# Patient Record
Sex: Female | Born: 1980
Health system: Southern US, Community
[De-identification: ages and names within clinical notes are randomized; demographics above are authoritative.]

## PROBLEM LIST (undated history)

## (undated) DIAGNOSIS — R569 Unspecified convulsions: Secondary | ICD-10-CM

## (undated) DIAGNOSIS — R519 Headache, unspecified: Secondary | ICD-10-CM

## (undated) DIAGNOSIS — K219 Gastro-esophageal reflux disease without esophagitis: Secondary | ICD-10-CM

## (undated) DIAGNOSIS — K7689 Other specified diseases of liver: Secondary | ICD-10-CM

## (undated) DIAGNOSIS — G709 Myoneural disorder, unspecified: Secondary | ICD-10-CM

## (undated) DIAGNOSIS — R7989 Other specified abnormal findings of blood chemistry: Secondary | ICD-10-CM

## (undated) DIAGNOSIS — Z87898 Personal history of other specified conditions: Secondary | ICD-10-CM

## (undated) HISTORY — PX: NO PAST SURGERIES: SHX2092

---
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
## 2001-10-03 ENCOUNTER — Other Ambulatory Visit: Admission: RE | Admit: 2001-10-03 | Discharge: 2001-10-03 | Payer: Self-pay | Admitting: Gynecology

## 2003-06-22 ENCOUNTER — Other Ambulatory Visit: Admission: RE | Admit: 2003-06-22 | Discharge: 2003-06-22 | Payer: Self-pay | Admitting: Obstetrics and Gynecology

## 2003-12-27 ENCOUNTER — Inpatient Hospital Stay (HOSPITAL_COMMUNITY): Admission: AD | Admit: 2003-12-27 | Discharge: 2003-12-30 | Payer: Self-pay | Admitting: Obstetrics and Gynecology

## 2003-12-31 ENCOUNTER — Encounter: Admission: RE | Admit: 2003-12-31 | Discharge: 2004-01-30 | Payer: Self-pay | Admitting: Obstetrics and Gynecology

## 2004-07-04 ENCOUNTER — Other Ambulatory Visit: Admission: RE | Admit: 2004-07-04 | Discharge: 2004-07-04 | Payer: Self-pay | Admitting: Obstetrics and Gynecology

## 2004-10-31 ENCOUNTER — Emergency Department (HOSPITAL_COMMUNITY): Admission: EM | Admit: 2004-10-31 | Discharge: 2004-10-31 | Payer: Self-pay | Admitting: Emergency Medicine

## 2005-07-30 ENCOUNTER — Other Ambulatory Visit: Admission: RE | Admit: 2005-07-30 | Discharge: 2005-07-30 | Payer: Self-pay | Admitting: Obstetrics and Gynecology

## 2007-07-05 ENCOUNTER — Emergency Department (HOSPITAL_COMMUNITY): Admission: EM | Admit: 2007-07-05 | Discharge: 2007-07-06 | Payer: Self-pay | Admitting: Emergency Medicine

## 2008-01-06 ENCOUNTER — Encounter (INDEPENDENT_AMBULATORY_CARE_PROVIDER_SITE_OTHER): Payer: Self-pay | Admitting: *Deleted

## 2008-07-09 ENCOUNTER — Ambulatory Visit: Payer: Self-pay | Admitting: Diagnostic Radiology

## 2008-07-09 ENCOUNTER — Emergency Department (HOSPITAL_BASED_OUTPATIENT_CLINIC_OR_DEPARTMENT_OTHER): Admission: EM | Admit: 2008-07-09 | Discharge: 2008-07-09 | Payer: Self-pay | Admitting: Emergency Medicine

## 2010-08-09 ENCOUNTER — Emergency Department (INDEPENDENT_AMBULATORY_CARE_PROVIDER_SITE_OTHER): Payer: 59

## 2010-08-09 ENCOUNTER — Emergency Department (HOSPITAL_BASED_OUTPATIENT_CLINIC_OR_DEPARTMENT_OTHER)
Admission: EM | Admit: 2010-08-09 | Discharge: 2010-08-09 | Disposition: A | Discharge status: Home / Self Care | Payer: 59 | Attending: Emergency Medicine | Admitting: Emergency Medicine

## 2010-08-09 DIAGNOSIS — G5690 Unspecified mononeuropathy of unspecified upper limb: Secondary | ICD-10-CM | POA: Insufficient documentation

## 2010-08-09 DIAGNOSIS — R209 Unspecified disturbances of skin sensation: Secondary | ICD-10-CM | POA: Insufficient documentation

## 2010-08-09 DIAGNOSIS — R279 Unspecified lack of coordination: Secondary | ICD-10-CM

## 2010-08-09 DIAGNOSIS — F17200 Nicotine dependence, unspecified, uncomplicated: Secondary | ICD-10-CM | POA: Insufficient documentation

## 2010-08-09 DIAGNOSIS — R29898 Other symptoms and signs involving the musculoskeletal system: Secondary | ICD-10-CM

## 2010-09-02 LAB — BASIC METABOLIC PANEL
GFR calc non Af Amer: >60 mL/min (ref >60)
Potassium: 3.4 mEq/L — ABNORMAL LOW (ref 3.5–5.1)
Sodium: 137 mEq/L (ref 135–145)

## 2010-09-02 LAB — DIFFERENTIAL
Eosinophils Relative: 2 % (ref 0–5)
Lymphocytes Relative: 28 % (ref 12–46)
Lymphs Abs: 2.5 10*3/uL (ref 0.7–4.0)
Monocytes Absolute: 0.7 10*3/uL (ref 0.1–1.0)
Monocytes Relative: 8 % (ref 3–12)

## 2010-09-02 LAB — D-DIMER, QUANTITATIVE: D-Dimer, Quant: <0.22 ug/mL-FEU (ref 0.00–0.48)

## 2010-09-02 LAB — CBC
HCT: 38.1 % (ref 36.0–46.0)
Hemoglobin: 13 g/dL (ref 12.0–15.0)
RBC: 4.15 MIL/uL (ref 3.87–5.11)
WBC: 8.9 10*3/uL (ref 4.0–10.5)

## 2010-09-02 LAB — POCT CARDIAC MARKERS
CKMB, poc: <1 ng/mL — ABNORMAL LOW (ref 1.0–8.0)
Myoglobin, poc: 21.6 ng/mL (ref 12–200)

## 2010-09-16 ENCOUNTER — Ambulatory Visit: Payer: 59 | Admitting: Occupational Therapy

## 2010-10-03 NOTE — Discharge Summary (Signed)
NAME:  Stacey Pena, Stacey Pena                          ACCOUNT NO.:  [id number]   MEDICAL RECORD NO.:  [id number]                   PATIENT TYPE:  INP   LOCATION:  9114                                 FACILITY:  [facility]   PHYSICIAN:  Malachi Pro. Ambrose Mantle, M.D.              DATE OF BIRTH:  [birth year]   DATE OF ADMISSION:  12/27/2003  DATE OF DISCHARGE:  12/30/2003                                 DISCHARGE SUMMARY   A 30 year old white female, para 0, gravida 1, at 40-4/[redacted] weeks gestation  with an [facility] of December 24, 2003, by a 7 week ultrasound, presented to  maternity admission unit approximately 2200 hours on December 27, 2003, with  complaint of contractions every 3-5 minutes which were very painful.  Cervix  had minimal change from her office exam the day prior to admission.  Cervix  2-3 cm, 70%, vertex at a -1, but given the patient's degree of discomfort,  she was admitted for pain relief and Pitocin augmentation.  The patient  continued to progress with minimal Pitocin, approximately 6 milliunits, and  received an epidural at 5 cm.  Prenatal care was uncomplicated.  Blood group  and type O positive with a negative antibody, RPR nonreactive, rubella  equivocal, hepatitis B surface antigen negative, HIV normal, GC and  Chlamydia negative, triple screen normal, group B strep negative, 1 hour  Glucola 87.   PAST OBSTETRIC HISTORY:  Negative.   GYNECOLOGIC HISTORY:  Negative.   ILLNESSES:  Asthma and depression.   SURGICAL HISTORY:  Negative.   ALLERGIES:  None.   MEDICATIONS:  None.   PHYSICAL EXAMINATION:  VITAL SIGNS:  On admission, the patient was afebrile  with normal vital signs.  Fetal heart tones were overall reassuring.  HEART:  Normal size and sounds.  No murmurs.  LUNGS:  Clear to P&A.  ABDOMEN:  Soft,  estimated fetal weight 7.5-8 pounds.  PELVIC:  At 6:30 a.m. on August 12, the cervix was 8 cm.  Artificial rupture  of the membranes produced clear fluid.   The patient reached full  dilatation and pushed well.  She delivered  spontaneously OA over a partial third-degree extension of a midline  laceration by Dr. Ambrose Mantle, a living female infant, 7 pounds 11 ounces, Apgars  of 9 at one and 9 at five minutes.  The placenta was intact.  Uterus normal.  Rectal negative.  Sphincter reinforced with 2-0 Vicryl.  Midline laceration  repaired with 2-0 Vicryl.  Blood loss about 400 mL.  Postpartum, the patient  did well and was discharged on the second postpartum day.  Initial  hemoglobin 13.6, hematocrit 40.1, white count 21,200, platelet count 214,00.  Follow up hemoglobin 12.5, RPR nonreactive.   FINAL DIAGNOSES:  1. Intrauterine pregnancy at 40+ weeks.  2. Delivered OA.   OPERATION:  Spontaneous delivery OA.  Midline laceration with partial third-  degree extension.   FINAL CONDITION:  Improved.   Instructions include our regular discharge instruction booklet.  The patient  is advised to return to the office in 6 weeks for follow up examination.  Percocet 5/325, #24 tabs 1-2 q.4-6h. p.r.n. pain is given at discharge.                                               Malachi Pro. Ambrose Mantle, M.D.    TFH/MEDQ  D:  12/30/2003  T:  12/30/2003  Job:  [id number]

## 2016-09-04 ENCOUNTER — Other Ambulatory Visit: Payer: Self-pay | Admitting: Family Medicine

## 2016-09-04 DIAGNOSIS — R1084 Generalized abdominal pain: Secondary | ICD-10-CM

## 2016-09-04 DIAGNOSIS — R197 Diarrhea, unspecified: Secondary | ICD-10-CM

## 2016-09-04 DIAGNOSIS — R110 Nausea: Secondary | ICD-10-CM

## 2016-09-07 ENCOUNTER — Other Ambulatory Visit: Payer: Self-pay

## 2016-09-07 ENCOUNTER — Ambulatory Visit
Admission: RE | Admit: 2016-09-07 | Discharge: 2016-09-07 | Disposition: A | Discharge status: Home / Self Care | Source: Ambulatory Visit | Attending: Family Medicine | Admitting: Family Medicine

## 2016-09-07 DIAGNOSIS — R110 Nausea: Secondary | ICD-10-CM

## 2016-09-07 DIAGNOSIS — R1084 Generalized abdominal pain: Secondary | ICD-10-CM

## 2016-09-07 DIAGNOSIS — R197 Diarrhea, unspecified: Secondary | ICD-10-CM

## 2016-09-07 MED ORDER — IOPAMIDOL (ISOVUE-300) INJECTION 61%
100.0000 mL | Freq: Once | INTRAVENOUS | Status: AC | PRN
  Administered 2016-09-07: 100 mL via INTRAVENOUS

## 2016-09-11 ENCOUNTER — Other Ambulatory Visit: Payer: Self-pay | Admitting: Family Medicine

## 2016-09-11 DIAGNOSIS — R160 Hepatomegaly, not elsewhere classified: Secondary | ICD-10-CM

## 2016-09-26 ENCOUNTER — Ambulatory Visit
Admission: RE | Admit: 2016-09-26 | Discharge: 2016-09-26 | Disposition: A | Discharge status: Home / Self Care | Source: Ambulatory Visit | Attending: Family Medicine | Admitting: Family Medicine

## 2016-09-26 DIAGNOSIS — R160 Hepatomegaly, not elsewhere classified: Secondary | ICD-10-CM

## 2016-09-26 MED ORDER — GADOXETATE DISODIUM 0.25 MMOL/ML IV SOLN
8.0000 mL | Freq: Once | INTRAVENOUS | Status: AC | PRN
  Administered 2016-09-26: 8 mL via INTRAVENOUS

## 2017-05-31 ENCOUNTER — Encounter: Payer: Self-pay | Admitting: Neurology

## 2017-05-31 ENCOUNTER — Ambulatory Visit (INDEPENDENT_AMBULATORY_CARE_PROVIDER_SITE_OTHER): Admitting: Neurology

## 2017-05-31 VITALS — BP 119/79 | HR 91 | Ht 65.0 in | Wt 176.0 lb

## 2017-05-31 DIAGNOSIS — R569 Unspecified convulsions: Secondary | ICD-10-CM | POA: Insufficient documentation

## 2017-05-31 NOTE — Progress Notes (Signed)
PATIENT: Stacey Pena DOB: 01-01-1981  Chief Complaint  Patient presents with   New Patient (Initial Visit)    Patient had her first seizure last weekend.     HISTORICAL  Stacey Pena is a 37 year old female, accompanied by her husband, seen in refer by primary care doctor Conley RollsLe, Thao P, for evaluation of seizure,Initial evaluation was on May 31, 2017.  She was previously healthy, deny a personal family history of seizure  On May 22, 2017, while shopping with her daughter in the mall, without warning signs, she suddenly her right arm, sliding to the floor, then had a witnessed generalized tonic-clonic seizure, no incontinence, with right lateral tongue biting.  Seizure last about 2 minutes, [facility],  laboratory evaluations normal CBC, hemoglobin 12.9, CMP, creatinine 0.6, CT head without contrast showed no acute abnormality  She recently started a new job  She had frequent abdominal pain, GI symptoms since summer of 2018, MRI of abdomen in May 2018 showed multiple enhancing hepatic lesions in both lobes, measuring up to 2.8 cm, favoring the diagnosis of FNH,  REVIEW OF SYSTEMS: Full 14 system review of systems performed and notable only for seizure, feeling hot, cold  ALLERGIES: Not on File  HOME MEDICATIONS: Current Outpatient Medications  Medication Sig Dispense Refill   ondansetron (ZOFRAN-ODT) 4 MG disintegrating tablet Take 1 tablet by mouth as needed.     No current facility-administered medications for this visit.     PAST MEDICAL HISTORY: History reviewed. No pertinent past medical history.  PAST SURGICAL HISTORY: History reviewed. No pertinent surgical history.  FAMILY HISTORY: History reviewed. No pertinent family history.  SOCIAL HISTORY:  Social History   Socioeconomic History   Marital status: Married    Spouse name: Not on file   Number of children: Not on file   Years of education: Not on file    Highest education level: Not on file  Social Needs   Financial resource strain: Not on file   Food insecurity - worry: Not on file   Food insecurity - inability: Not on file   Transportation needs - medical: Not on file   Transportation needs - non-medical: Not on file  Occupational History   Not on file  Tobacco Use   Smoking status: Former Smoker   Smokeless tobacco: Never Used  Substance and Sexual Activity   Alcohol use: Yes    Frequency: Never    Comment: 2-3 nights/wk   Drug use: No   Sexual activity: Not on file  Other Topics Concern   Not on file  Social History Narrative   Not on file     PHYSICAL EXAM   Vitals:   05/31/17 1518  BP: 119/79  Pulse: 91  Weight: 176 lb (79.8 kg)  Height: 5\' 5"  (1.651 m)    Not recorded      Body mass index is 29.29 kg/m.  PHYSICAL EXAMNIATION:  Gen: NAD, conversant, well nourised, obese, well groomed                     Cardiovascular: Regular rate rhythm, no peripheral edema, warm, nontender. Eyes: Conjunctivae clear without exudates or hemorrhage Neck: Supple, no carotid bruits. Pulmonary: Clear to auscultation bilaterally   NEUROLOGICAL EXAM:  MENTAL STATUS: Speech:    Speech is normal; fluent and spontaneous with normal comprehension.  Cognition:     Orientation to time, place and person     Normal recent and remote memory  Normal Attention span and concentration     Normal Language, naming, repeating,spontaneous speech     Fund of knowledge   CRANIAL NERVES: CN II: Visual fields are full to confrontation. Fundoscopic exam is normal with sharp discs and no vascular changes. Pupils are round equal and briskly reactive to light. CN III, IV, VI: extraocular movement are normal. No ptosis. CN V: Facial sensation is intact to pinprick in all 3 divisions bilaterally. Corneal responses are intact.  CN VII: Face is symmetric with normal eye closure and smile. CN VIII: Hearing is normal to rubbing  fingers CN IX, X: Palate elevates symmetrically. Phonation is normal. CN XI: Head turning and shoulder shrug are intact CN XII: Tongue is midline with normal movements and no atrophy.  MOTOR: There is no pronator drift of out-stretched arms. Muscle bulk and tone are normal. Muscle strength is normal.  REFLEXES: Reflexes are 2+ and symmetric at the biceps, triceps, knees, and ankles. Plantar responses are flexor.  SENSORY: Intact to light touch, pinprick, positional sensation and vibratory sensation are intact in fingers and toes.  COORDINATION: Rapid alternating movements and fine finger movements are intact. There is no dysmetria on finger-to-nose and heel-knee-shin.    GAIT/STANCE: Posture is normal. Gait is steady with normal steps, base, arm swing, and turning. Heel and toe walking are normal. Tandem gait is normal.  Romberg is absent.   DIAGNOSTIC DATA (LABS, IMAGING, TESTING) - I reviewed patient records, labs, notes, testing and imaging myself where available.   ASSESSMENT AND PLAN  Stacey Pena is a 37 y.o. female   New onset seizure on May 22, 2017  MRI brain w/wo  EEG  No driving until seizure free for 6 months   Levert Feinstein, M.D. Ph.D.  [facility] [address], [address] [place], [place] [address] Ph: [phone] Fax: [phone]  CC:Le, Thao P, DO

## 2017-06-07 ENCOUNTER — Ambulatory Visit (INDEPENDENT_AMBULATORY_CARE_PROVIDER_SITE_OTHER): Admitting: Neurology

## 2017-06-07 DIAGNOSIS — R569 Unspecified convulsions: Secondary | ICD-10-CM

## 2017-06-11 NOTE — Procedures (Signed)
° °  HISTORY: 37 year old female, with history of seizure  TECHNIQUE:  16 channel EEG was performed based on standard 10-16 international system. One channel was dedicated to EKG, which has demonstrates normal sinus rhythm of 78 beats per minutes.  Upon awakening, the posterior background activity was dysarrhythmic, 6-7 Hz, theta range, reactive to eye opening and closure.  There was no evidence of epileptiform discharge.  Photic stimulation was performed, which induced a symmetric photic driving.  Hyperventilation was performed, there was no abnormality elicit.  Stage II sleep was achieved, as evident by K complexes, sleep spindles,  CONCLUSION: This is a an abnormal awake and asleep EEG.  There is electrodiagnostic evidence of generalized background slowing, indicating bi- hemisphere malfunction.  Levert FeinsteinYijun Alegandra Sommers, M.D. Ph.D.  [facility] [address] [place], [place] [address] Phone: [phone] Fax:      [phone]

## 2017-06-13 ENCOUNTER — Ambulatory Visit
Admission: RE | Admit: 2017-06-13 | Discharge: 2017-06-13 | Disposition: A | Discharge status: Home / Self Care | Source: Ambulatory Visit | Attending: Neurology | Admitting: Neurology

## 2017-06-13 DIAGNOSIS — R569 Unspecified convulsions: Secondary | ICD-10-CM | POA: Diagnosis not present

## 2017-06-13 MED ORDER — GADOBENATE DIMEGLUMINE 529 MG/ML IV SOLN
14.0000 mL | Freq: Once | INTRAVENOUS | Status: AC | PRN
  Administered 2017-06-13: 14 mL via INTRAVENOUS

## 2017-06-14 ENCOUNTER — Telehealth: Payer: Self-pay | Admitting: Neurology

## 2017-06-14 MED ORDER — LAMOTRIGINE 25 MG PO TABS: ORAL_TABLET | ORAL | 0 refills | Status: DC

## 2017-06-14 MED ORDER — LAMOTRIGINE 100 MG PO TABS: 100.0000 mg | ORAL_TABLET | Freq: Two times a day (BID) | ORAL | 11 refills | Status: DC

## 2017-06-14 NOTE — Telephone Encounter (Signed)
I have called patient, MRI of the brain is normal, EEG showed generalized slowing, dysarrythmic activity,  With the description of seizure activity starting with right arm, generalized tonic-clonic seizure,  Above findings could indicate partial complex with secondary generalization  Proceed with lamotrigine, 25 titrating to 100 mg twice a day,  No driving until seizure-free for 6 months  Keep follow-up appointment on July 12, 2017.

## 2017-06-28 ENCOUNTER — Other Ambulatory Visit: Payer: Self-pay | Admitting: *Deleted

## 2017-06-28 ENCOUNTER — Encounter: Payer: Self-pay | Admitting: *Deleted

## 2017-06-28 MED ORDER — LEVETIRACETAM 500 MG PO TABS: 500.0000 mg | ORAL_TABLET | Freq: Two times a day (BID) | ORAL | 11 refills | Status: DC

## 2017-06-28 NOTE — Telephone Encounter (Signed)
Patient upped dosage of lamoTRIgine (LAMICTAL) 25 MG tablet to 75mg  2 times a day yesterday. Today she has a rash all over her body and worse on her neck. Please call and advise.

## 2017-06-28 NOTE — Addendum Note (Signed)
Addended by: Lindell SparKIRKMAN, Babygirl Trager C on: 06/28/2017 12:08 PM   Modules accepted: Orders

## 2017-06-28 NOTE — Telephone Encounter (Signed)
Dr. Terrace ArabiaYan reviewed chart.  Per vo by Dr. Terrace ArabiaYan, discontinue lamotrigine and start levetiracetam 500mg , one tablet BID.  She does not currently have mood disorder.  Reviewed potential side effects of levetiracetam with patient.  She was also instructed to drink lots of water to flush system.  Lamotrigine added to her allergy list.  She is agreeable to this plan.  [place] and the new prescription was sent to the [facility] close to her office.

## 2017-07-12 ENCOUNTER — Telehealth: Payer: Self-pay | Admitting: Neurology

## 2017-07-12 ENCOUNTER — Ambulatory Visit (INDEPENDENT_AMBULATORY_CARE_PROVIDER_SITE_OTHER): Admitting: Neurology

## 2017-07-12 ENCOUNTER — Encounter: Payer: Self-pay | Admitting: Neurology

## 2017-07-12 VITALS — BP 141/95 | HR 91 | Ht 65.0 in | Wt 172.0 lb

## 2017-07-12 DIAGNOSIS — R569 Unspecified convulsions: Secondary | ICD-10-CM

## 2017-07-12 MED ORDER — LEVETIRACETAM 500 MG PO TABS: 500.0000 mg | ORAL_TABLET | Freq: Two times a day (BID) | ORAL | 4 refills | Status: DC

## 2017-07-12 NOTE — Telephone Encounter (Signed)
Called patient to schedule sleep deprived EEG. Patient states she would

## 2017-07-12 NOTE — Progress Notes (Signed)
PATIENT: Stacey Pena DOB: 02/27/1981  Chief Complaint  Patient presents with   Seizures    She is here with her husband, Vincenza Hews. She would like to review her EEG and MRI.  She had rash with Lamictal.  She is doing well on Keppra 500mg , one tablet BID.  No further seizures reported.     HISTORICAL  Stacey Pena is a 37 year old female, accompanied by her husband, seen in refer by primary care doctor Conley Rolls, Thao P, for evaluation of seizure,Initial evaluation was on May 31, 2017.  She was previously healthy, deny a personal family history of seizure  On May 22, 2017, while shopping with her daughter in the mall, without warning signs, she suddenly her right arm, sliding to the floor, then had a witnessed generalized tonic-clonic seizure, no incontinence, with right lateral tongue biting.  Seizure last about 2 minutes, [facility],  laboratory evaluations normal CBC, hemoglobin 12.9, CMP, creatinine 0.6, CT head without contrast showed no acute abnormality  She recently started a new job  She had frequent abdominal pain, GI symptoms since summer of 2018, MRI of abdomen in May 2018 showed multiple enhancing hepatic lesions in both lobes, measuring up to 2.8 cm, favoring the diagnosis of FNH,  Update July 12, 2017:  MRI of the brain with and without contrast was normal on June 13, 2017.  EEG in January 2019 showed evidence of mild background slowing,  She has tried lamotrigine, cause rash, she is now taking Keppra 500 mg twice a day, tolerating it was normal well.   REVIEW OF SYSTEMS: Full 14 system review of systems performed and notable only for seizure, feeling hot, cold  ALLERGIES: Allergies  Allergen Reactions   Gadolinium Derivatives Rash    Pt had a rash across her chest following her injection of Multihance.  Rash assessed by Dr. Tyron Russell, pt given 50mg  grams taken immediately and released with instructions to call if anything new  arose.     Lamictal [Lamotrigine] Rash    HOME MEDICATIONS: Current Outpatient Medications  Medication Sig Dispense Refill   ciprofloxacin (CIPRO) 500 MG tablet Take 500 mg by mouth 2 (two) times daily.     levETIRAcetam (KEPPRA) 500 MG tablet Take 1 tablet (500 mg total) by mouth 2 (two) times daily. 60 tablet 11   ondansetron (ZOFRAN-ODT) 4 MG disintegrating tablet Take 1 tablet by mouth as needed.     No current facility-administered medications for this visit.     PAST MEDICAL HISTORY: History reviewed. No pertinent past medical history.  PAST SURGICAL HISTORY: History reviewed. No pertinent surgical history.  FAMILY HISTORY: History reviewed. No pertinent family history.  SOCIAL HISTORY:  Social History   Socioeconomic History   Marital status: Married    Spouse name: Not on file   Number of children: Not on file   Years of education: Not on file   Highest education level: Not on file  Social Needs   Financial resource strain: Not on file   Food insecurity - worry: Not on file   Food insecurity - inability: Not on file   Transportation needs - medical: Not on file   Transportation needs - non-medical: Not on file  Occupational History   Not on file  Tobacco Use   Smoking status: Former Smoker   Smokeless tobacco: Never Used  Substance and Sexual Activity   Alcohol use: Yes    Frequency: Never    Comment: 2-3 nights/wk  Drug use: No   Sexual activity: Not on file  Other Topics Concern   Not on file  Social History Narrative   Not on file     PHYSICAL EXAM   Vitals:   07/12/17 1025  BP: (!) 141/95  Pulse: 91  Weight: 172 lb (78 kg)  Height: 5\' 5"  (1.651 m)    Not recorded      Body mass index is 28.62 kg/m.  PHYSICAL EXAMNIATION:  Gen: NAD, conversant, well nourised, obese, well groomed                     Cardiovascular: Regular rate rhythm, no peripheral edema, warm, nontender. Eyes: Conjunctivae clear without  exudates or hemorrhage Neck: Supple, no carotid bruits. Pulmonary: Clear to auscultation bilaterally   NEUROLOGICAL EXAM:  MENTAL STATUS: Speech:    Speech is normal; fluent and spontaneous with normal comprehension.  Cognition:     Orientation to time, place and person     Normal recent and remote memory     Normal Attention span and concentration     Normal Language, naming, repeating,spontaneous speech     Fund of knowledge   CRANIAL NERVES: CN II: Visual fields are full to confrontation. Fundoscopic exam is normal with sharp discs and no vascular changes. Pupils are round equal and briskly reactive to light. CN III, IV, VI: extraocular movement are normal. No ptosis. CN V: Facial sensation is intact to pinprick in all 3 divisions bilaterally. Corneal responses are intact.  CN VII: Face is symmetric with normal eye closure and smile. CN VIII: Hearing is normal to rubbing fingers CN IX, X: Palate elevates symmetrically. Phonation is normal. CN XI: Head turning and shoulder shrug are intact CN XII: Tongue is midline with normal movements and no atrophy.  MOTOR: There is no pronator drift of out-stretched arms. Muscle bulk and tone are normal. Muscle strength is normal.  REFLEXES: Reflexes are 2+ and symmetric at the biceps, triceps, knees, and ankles. Plantar responses are flexor.  SENSORY: Intact to light touch, pinprick, positional sensation and vibratory sensation are intact in fingers and toes.  COORDINATION: Rapid alternating movements and fine finger movements are intact. There is no dysmetria on finger-to-nose and heel-knee-shin.    GAIT/STANCE: Posture is normal. Gait is steady with normal steps, base, arm swing, and turning. Heel and toe walking are normal. Tandem gait is normal.  Romberg is absent.   DIAGNOSTIC DATA (LABS, IMAGING, TESTING) - I reviewed patient records, labs, notes, testing and imaging myself where available.   ASSESSMENT AND PLAN  [name]  Blouch is a 37 y.o. female   New onset seizure on May 22, 2017  MRI brain w/wo was normal  EEG showed mild background slowing.  Keep keppra 500mg  bid  Repeat sleep deprived EEG.  No driving until seizure free for 6 months     Levert FeinsteinYijun Felder Lebeda, M.D. Ph.D.  [facility] [address], [address] [place], [place] [address] Ph: [phone] Fax: [phone]  CC:Le, Thao P, DO

## 2017-07-13 ENCOUNTER — Telehealth: Payer: Self-pay | Admitting: Neurology

## 2017-07-13 NOTE — Telephone Encounter (Signed)
Please disregard previous message.

## 2017-07-13 NOTE — Telephone Encounter (Signed)
Called patient to schedule sleep deprived EEG that is closer to one year follow-up. Left patient a voicemail to call us back to schedule.

## 2017-07-13 NOTE — Telephone Encounter (Signed)
Called patient to schedule her sleep deprived EEG. Patient states she would like an EEG near her one year follow-up.

## 2017-12-27 ENCOUNTER — Telehealth: Payer: Self-pay | Admitting: Neurology

## 2017-12-27 NOTE — Telephone Encounter (Signed)
Spoke to patient - she is concerned about frequent headaches over the last three weeks.  She estimates having them 3-4 times per week.  Ibuprofen is mildly helpful.  She tends to get more relief by lying in a dark room with a cool compress on her head.  She has also noticed her right eyelid drooping intermittently.  This symptom has also been present for three weeks and can occur with or without a headache.  She has been on Keppra 500mg  twice daily since 06/28/17.  She will come in for an appt this week.  I have asked her to take a picture of her eye when it is actively drooping so that Dr. Terrace ArabiaYan can review at her appt.

## 2017-12-27 NOTE — Telephone Encounter (Signed)
Pt is getting HA's on right side of the head and noticed the right eye lid droops for the past 3 weeks intermittently. She is concerned levETIRAcetam (KEPPRA) 500 MG tablet could be causing this. Please call to advise

## 2017-12-30 ENCOUNTER — Encounter: Payer: Self-pay | Admitting: Neurology

## 2017-12-30 ENCOUNTER — Ambulatory Visit: Payer: BLUE CROSS/BLUE SHIELD | Admitting: Neurology

## 2017-12-30 VITALS — BP 132/88 | HR 89 | Ht 65.0 in | Wt 168.8 lb

## 2017-12-30 DIAGNOSIS — G43709 Chronic migraine without aura, not intractable, without status migrainosus: Secondary | ICD-10-CM | POA: Diagnosis not present

## 2017-12-30 DIAGNOSIS — R569 Unspecified convulsions: Secondary | ICD-10-CM | POA: Diagnosis not present

## 2017-12-30 DIAGNOSIS — IMO0002 Reserved for concepts with insufficient information to code with codable children: Secondary | ICD-10-CM

## 2017-12-30 MED ORDER — SUMATRIPTAN SUCCINATE 50 MG PO TABS: 50.0000 mg | ORAL_TABLET | ORAL | 6 refills | Status: DC | PRN

## 2017-12-30 NOTE — Progress Notes (Signed)
PATIENT: Stacey Pena Anacker DOB: [birth year]  Chief Complaint  Patient presents with   Headache    She is concerned about frequent headaches over the last three weeks.  She estimates having them 3-4 times per week.  Ibuprofen is mildly helpful.  She tends to get more relief by lying in a dark room with a cool compress on her head.  She has also noticed her right eyelid drooping intermittently (she has pictures).  This symptom has also been present for three weeks and can occur with or without a headache.   Seizures    She is taking Keppra 500mg  BID, as prescribed.  No seizure activity reported.     HISTORICAL  Stacey Pena Kranz is a 37 year old female, accompanied by her husband, seen in refer by primary care doctor Conley RollsLe, Thao P, for evaluation of seizure,Initial evaluation was on May 31, 2017.  She was previously healthy, deny a personal family history of seizure  On May 22, 2017, while shopping with her daughter in the mall, without warning signs, she suddenly her right arm, sliding to the floor, then had a witnessed generalized tonic-clonic seizure, no incontinence, with right lateral tongue biting.  Seizure last about 2 minutes, [facility],  laboratory evaluations normal CBC, hemoglobin 12.9, CMP, creatinine 0.6, CT head without contrast showed no acute abnormality  She recently started a new job  She had frequent abdominal pain, GI symptoms since summer of 2018, MRI of abdomen in May 2018 showed multiple enhancing hepatic lesions in both lobes, measuring up to 2.8 cm, favoring the diagnosis of FNH,  Update July 12, 2017:  MRI of the brain with and without contrast was normal on June 13, 2017.  EEG in January 2019 showed evidence of mild background slowing,  She has tried lamotrigine, cause rash, she is now taking Keppra 500 mg twice a day, tolerating it was normal well.  UPDATE December 30 2017: She complains of frequent right-sided headaches  since July 2019, about 3-4 times each week, right retro-orbital, moderate pain, she tends to close her eyes which helps her headaches sound, oftentimes is getting worse as day goes on,  She has no recurrent seizure REVIEW OF SYSTEMS: Full 14 system review of systems performed and notable only for memory loss, droopy eyelid, agitation, abdominal pain, nausea vomiting blurry vision, chills, excessive sweating  ALLERGIES: Allergies  Allergen Reactions   Lamictal [Lamotrigine] Rash   Gadolinium Derivatives Rash    Pt had a rash across her chest following her injection of Multihance.  Rash assessed by Dr. Tyron RussellBoles, pt given 50mg  grams taken immediately and released with instructions to call if anything new arose.      HOME MEDICATIONS: Current Outpatient Medications  Medication Sig Dispense Refill   ibuprofen (ADVIL,MOTRIN) 200 MG tablet Take 200 mg by mouth daily as needed.     levETIRAcetam (KEPPRA) 500 MG tablet Take 1 tablet (500 mg total) by mouth 2 (two) times daily. 180 tablet 4   ondansetron (ZOFRAN-ODT) 4 MG disintegrating tablet Take 1 tablet by mouth as needed.     No current facility-administered medications for this visit.     PAST MEDICAL HISTORY: History reviewed. No pertinent past medical history.  PAST SURGICAL HISTORY: History reviewed. No pertinent surgical history.  FAMILY HISTORY: History reviewed. No pertinent family history.  SOCIAL HISTORY:  Social History   Socioeconomic History   Marital status: Married    Spouse name: Not on file   Number of children:  Not on file   Years of education: Not on file   Highest education level: Not on file  Occupational History   Not on file  Social Needs   Financial resource strain: Not on file   Food insecurity:    Worry: Not on file    Inability: Not on file   Transportation needs:    Medical: Not on file    Non-medical: Not on file  Tobacco Use   Smoking status: Former Smoker   Smokeless tobacco:  Never Used  Substance and Sexual Activity   Alcohol use: Yes    Frequency: Never    Comment: 2-3 nights/wk   Drug use: No   Sexual activity: Not on file  Lifestyle   Physical activity:    Days per week: Not on file    Minutes per session: Not on file   Stress: Not on file  Relationships   Social connections:    Talks on phone: Not on file    Gets together: Not on file    Attends religious service: Not on file    Active member of club or organization: Not on file    Attends meetings of clubs or organizations: Not on file    Relationship status: Not on file   Intimate partner violence:    Fear of current or ex partner: Not on file    Emotionally abused: Not on file    Physically abused: Not on file    Forced sexual activity: Not on file  Other Topics Concern   Not on file  Social History Narrative   Not on file     PHYSICAL EXAM   Vitals:   12/30/17 1048  BP: 132/88  Pulse: 89  Weight: 168 lb 12 oz (76.5 kg)  Height: 5\' 5"  (1.651 m)    Not recorded      Body mass index is 28.08 kg/m.  PHYSICAL EXAMNIATION:  Gen: NAD, conversant, well nourised, obese, well groomed                     Cardiovascular: Regular rate rhythm, no peripheral edema, warm, nontender. Eyes: Conjunctivae clear without exudates or hemorrhage Neck: Supple, no carotid bruits. Pulmonary: Clear to auscultation bilaterally   NEUROLOGICAL EXAM:  MENTAL STATUS: Speech:    Speech is normal; fluent and spontaneous with normal comprehension.  Cognition:     Orientation to time, place and person     Normal recent and remote memory     Normal Attention span and concentration     Normal Language, naming, repeating,spontaneous speech     Fund of knowledge   CRANIAL NERVES: CN II: Visual fields are full to confrontation. Fundoscopic exam is normal with sharp discs and no vascular changes. Pupils are round equal and briskly reactive to light. CN III, IV, VI: extraocular movement are  normal. No ptosis. CN V: Facial sensation is intact to pinprick in all 3 divisions bilaterally. Corneal responses are intact.  CN VII: Face is symmetric with normal eye closure and smile. CN VIII: Hearing is normal to rubbing fingers CN IX, X: Palate elevates symmetrically. Phonation is normal. CN XI: Head turning and shoulder shrug are intact CN XII: Tongue is midline with normal movements and no atrophy.  MOTOR: There is no pronator drift of out-stretched arms. Muscle bulk and tone are normal. Muscle strength is normal.  REFLEXES: Reflexes are 2+ and symmetric at the biceps, triceps, knees, and ankles. Plantar responses are flexor.  SENSORY: Intact  to light touch, pinprick, positional sensation and vibratory sensation are intact in fingers and toes.  COORDINATION: Rapid alternating movements and fine finger movements are intact. There is no dysmetria on finger-to-nose and heel-knee-shin.    GAIT/STANCE: Posture is normal. Gait is steady with normal steps, base, arm swing, and turning. Heel and toe walking are normal. Tandem gait is normal.  Romberg is absent.   DIAGNOSTIC DATA (LABS, IMAGING, TESTING) - I reviewed patient records, labs, notes, testing and imaging myself where available.   ASSESSMENT AND PLAN  Stacey Pena Mesina is a 37 y.o. female   New onset seizure on May 22, 2017  MRI brain w/wo was normal  EEG showed mild background slowing.  Keep keppra 500mg  bid  Chronic migraine headaches  Imitrex as needed    Levert FeinsteinYijun Sonali Wivell, M.D. Ph.D.  [facility] [address], [address] [place], [place] [address] Ph: [phone] Fax: [phone]  CC:Le, Thao P, DO

## 2018-04-04 ENCOUNTER — Telehealth: Payer: Self-pay | Admitting: Neurology

## 2018-04-04 NOTE — Telephone Encounter (Signed)
Spoke to patient - she has noticed intermittent muscle spasms at the right side of her mouth for two days.  Per vo by Dr. Terrace Arabia, she should monitor symptoms and let us know if they do not resolve on their own.  She was in agreement to this plan.

## 2018-04-04 NOTE — Telephone Encounter (Signed)
Pt has called to report that for about a week she has noticed  Spasms in her face right side toward mouth.  Pt has a f/u appointment in Feb 2020 but does not want to wait that long to have this addressed.  Pt is asking for a call from RN to discuss

## 2018-07-13 ENCOUNTER — Encounter: Payer: Self-pay | Admitting: Neurology

## 2018-07-13 ENCOUNTER — Ambulatory Visit (INDEPENDENT_AMBULATORY_CARE_PROVIDER_SITE_OTHER): Payer: 59 | Admitting: Neurology

## 2018-07-13 VITALS — BP 134/88 | HR 96 | Ht 65.0 in | Wt 169.0 lb

## 2018-07-13 DIAGNOSIS — G43709 Chronic migraine without aura, not intractable, without status migrainosus: Secondary | ICD-10-CM | POA: Diagnosis not present

## 2018-07-13 DIAGNOSIS — R569 Unspecified convulsions: Secondary | ICD-10-CM | POA: Diagnosis not present

## 2018-07-13 DIAGNOSIS — IMO0002 Reserved for concepts with insufficient information to code with codable children: Secondary | ICD-10-CM

## 2018-07-13 MED ORDER — RIZATRIPTAN BENZOATE 10 MG PO TBDP: 10.0000 mg | ORAL_TABLET | ORAL | 6 refills | Status: DC | PRN

## 2018-07-13 MED ORDER — LEVETIRACETAM 500 MG PO TABS: 500.0000 mg | ORAL_TABLET | Freq: Two times a day (BID) | ORAL | 4 refills | Status: DC

## 2018-07-13 NOTE — Progress Notes (Signed)
PATIENT: Stacey Pena DOB: 1980/10/15  Chief Complaint  Patient presents with   Seizures    She has continued Keppra , one tablet BID.  No seizure activity.   Migraine    She is unable to tolerate sumatriptan.  She vomits every time she uses it.  She is now using OTC ibuprofen.  She estimates one migraine every three weeks but says she has a constant pressure on the right side of her head.  She is also concerned about three events of sudden waking from sleep then seeing bright lights for a few seconds before her vision returns to normal.       HISTORICAL  Stacey Pena is a 38 year old female, accompanied by her husband, seen in refer by primary care doctor Conley Rolls, Thao P, for evaluation of seizure,Initial evaluation was on May 31, 2017.  She was previously healthy, deny a personal family history of seizure  On May 22, 2017, while shopping with her daughter in the mall, without warning signs, she suddenly her right arm, sliding to the floor, then had a witnessed generalized tonic-clonic seizure, no incontinence, with right lateral tongue biting.  Seizure last about 2 minutes, [facility],  laboratory evaluations normal CBC, hemoglobin 12.9, CMP, creatinine 0.6, CT head without contrast showed no acute abnormality  She recently started a new job  She had frequent abdominal pain, GI symptoms since summer of 2018, MRI of abdomen in May 2018 showed multiple enhancing hepatic lesions in both lobes, measuring up to 2.8 cm, favoring the diagnosis of FNH,  Update July 12, 2017:  MRI of the brain with and without contrast was normal on June 13, 2017.  EEG in January 2019 showed evidence of mild background slowing,  She has tried lamotrigine, cause rash, she is now taking Keppra 500 mg twice a day, tolerating it was normal well.  UPDATE December 30 2017: She complains of frequent right-sided headaches since July 2019, about 3-4 times each week,  right retro-orbital, moderate pain, she tends to close her eyes which helps her headaches sound, oftentimes is getting worse as day goes on,  She has no recurrent seizure  UPDATE Jul 13 2018: She overall is doing very well, there was no recurrent seizure tolerating Keppra 500 mg twice a day, has intermittent migraine headaches every 2 to 3 weeks, complains of nausea with Imitrex, taking ibuprofen as needed works well for her most of the time   REVIEW OF SYSTEMS: Full 14 system review of systems performed and notable only for chills, fatigue, excessive sweating, diarrhea, vomiting, snoring, sleep talking headaches   all rest review of the system were negative  ALLERGIES: Allergies  Allergen Reactions   Lamictal [Lamotrigine] Rash   Sumatriptan Nausea And Vomiting   Gadolinium Derivatives Rash    Pt had a rash across her chest following her injection of Multihance.  Rash assessed by Dr. Tyron Russell, pt given  grams taken immediately and released with instructions to call if anything new arose.      HOME MEDICATIONS: Current Outpatient Medications  Medication Sig Dispense Refill   ibuprofen (ADVIL,MOTRIN) 200 MG tablet Take 200 mg by mouth daily as needed.     levETIRAcetam (KEPPRA) 500 MG tablet Take 1 tablet (500 mg total) by mouth 2 (two) times daily. 180 tablet 4   ondansetron (ZOFRAN) 4 MG tablet Take 4 mg by mouth every 8 (eight) hours as needed for nausea or vomiting.     No current facility-administered  medications for this visit.     PAST MEDICAL HISTORY: History reviewed. No pertinent past medical history.  PAST SURGICAL HISTORY: History reviewed. No pertinent surgical history.  FAMILY HISTORY: History reviewed. No pertinent family history.  SOCIAL HISTORY:  Social History   Socioeconomic History   Marital status: Married    Spouse name: Not on file   Number of children: Not on file   Years of education: Not on file   Highest education level: Not on  file  Occupational History   Not on file  Social Needs   Financial resource strain: Not on file   Food insecurity:    Worry: Not on file    Inability: Not on file   Transportation needs:    Medical: Not on file    Non-medical: Not on file  Tobacco Use   Smoking status: Former Smoker   Smokeless tobacco: Never Used  Substance and Sexual Activity   Alcohol use: Yes    Frequency: Never    Comment: 2-3 nights/wk   Drug use: No   Sexual activity: Not on file  Lifestyle   Physical activity:    Days per week: Not on file    Minutes per session: Not on file   Stress: Not on file  Relationships   Social connections:    Talks on phone: Not on file    Gets together: Not on file    Attends religious service: Not on file    Active member of club or organization: Not on file    Attends meetings of clubs or organizations: Not on file    Relationship status: Not on file   Intimate partner violence:    Fear of current or ex partner: Not on file    Emotionally abused: Not on file    Physically abused: Not on file    Forced sexual activity: Not on file  Other Topics Concern   Not on file  Social History Narrative   Not on file     PHYSICAL EXAM   Vitals:   07/13/18 1045  BP: 134/88  Pulse: 96  Weight: 169 lb (76.7 kg)  Height: 5\' 5"  (1.651 m)    Not recorded      Body mass index is 28.12 kg/m.  PHYSICAL EXAMNIATION:  Gen: NAD, conversant, well nourised, obese, well groomed                     Cardiovascular: Regular rate rhythm, no peripheral edema, warm, nontender. Eyes: Conjunctivae clear without exudates or hemorrhage Neck: Supple, no carotid bruits. Pulmonary: Clear to auscultation bilaterally   NEUROLOGICAL EXAM:  MENTAL STATUS: Speech:    Speech is normal; fluent and spontaneous with normal comprehension.  Cognition:     Orientation to time, place and person     Normal recent and remote memory     Normal Attention span and concentration      Normal Language, naming, repeating,spontaneous speech     Fund of knowledge   CRANIAL NERVES: CN II: Visual fields are full to confrontation. Fundoscopic exam is normal with sharp discs and no vascular changes. Pupils are round equal and briskly reactive to light. CN III, IV, VI: extraocular movement are normal. No ptosis. CN V: Facial sensation is intact to pinprick in all 3 divisions bilaterally. Corneal responses are intact.  CN VII: Face is symmetric with normal eye closure and smile. CN VIII: Hearing is normal to rubbing fingers CN IX, X: Palate elevates symmetrically. Phonation  is normal. CN XI: Head turning and shoulder shrug are intact CN XII: Tongue is midline with normal movements and no atrophy.  MOTOR: There is no pronator drift of out-stretched arms. Muscle bulk and tone are normal. Muscle strength is normal.  REFLEXES: Reflexes are 2+ and symmetric at the biceps, triceps, knees, and ankles. Plantar responses are flexor.  SENSORY: Intact to light touch, pinprick, positional sensation and vibratory sensation are intact in fingers and toes.  COORDINATION: Rapid alternating movements and fine finger movements are intact. There is no dysmetria on finger-to-nose and heel-knee-shin.    GAIT/STANCE: Posture is normal. Gait is steady with normal steps, base, arm swing, and turning. Heel and toe walking are normal. Tandem gait is normal.  Romberg is absent.   DIAGNOSTIC DATA (LABS, IMAGING, TESTING) - I reviewed patient records, labs, notes, testing and imaging myself where available.   ASSESSMENT AND PLAN  Stacey Pena is a 38 y.o. female   Seizure on May 22, 2017  MRI brain w/wo was normal  EEG showed mild background slowing.  Keep keppra 500mg  bid  Chronic migraine headaches  Complains of nausea with Imitrex, try Maxalt or NSAIDs as needed,    Levert Feinstein, M.D. Ph.D.  [facility] [address] [place], [place] [address] Ph: [phone] Fax: [phone]  CC:Le, Thao P, DO

## 2018-12-01 ENCOUNTER — Other Ambulatory Visit: Payer: Self-pay | Admitting: Gastroenterology

## 2018-12-01 DIAGNOSIS — K7689 Other specified diseases of liver: Secondary | ICD-10-CM

## 2018-12-06 ENCOUNTER — Ambulatory Visit
Admission: RE | Admit: 2018-12-06 | Discharge: 2018-12-06 | Disposition: A | Discharge status: Home / Self Care | Payer: Self-pay | Source: Ambulatory Visit | Attending: Gastroenterology | Admitting: Gastroenterology

## 2018-12-06 DIAGNOSIS — K7689 Other specified diseases of liver: Secondary | ICD-10-CM

## 2019-01-11 ENCOUNTER — Ambulatory Visit: Payer: 59 | Admitting: Neurology

## 2019-02-06 NOTE — Progress Notes (Signed)
PATIENT: Stacey Pena DOB: [birth year]  REASON FOR VISIT: follow up HISTORY FROM: patient  HISTORY OF PRESENT ILLNESS: Today 02/07/19  HISTORY  Stacey Pena is a 38 year old female, accompanied by her husband, seen in refer by primary care doctor Conley RollsLe, Thao P, for evaluation of seizure,Initial evaluation was on May 31, 2017.  She was previously healthy, deny a personal family history of seizure  On May 22, 2017, while shopping with her daughter in the mall, without warning signs, she suddenly her right arm, sliding to the floor, then had a witnessed generalized tonic-clonic seizure, no incontinence, with right lateral tongue biting.  Seizure last about 2 minutes, [facility],  laboratory evaluations normal CBC, hemoglobin 12.9, CMP, creatinine 0.6, CT head without contrast showed no acute abnormality  She recently started a new job  She had frequent abdominal pain, GI symptoms since summer of 2018, MRI of abdomen in May 2018 showed multiple enhancing hepatic lesions in both lobes, measuring up to 2.8 cm, favoring the diagnosis of FNH,  Update July 12, 2017:  MRI of the brain with and without contrast was normal on June 13, 2017.  EEG in January 2019 showed evidence of mild background slowing,  She has tried lamotrigine, cause rash, she is now taking Keppra 500 mg twice a day, tolerating it was normal well.  UPDATE December 30 2017: She complains of frequent right-sided headaches since July 2019, about 3-4 times each week, right retro-orbital, moderate pain, she tends to close her eyes which helps her headaches sound, oftentimes is getting worse as day goes on,  She has no recurrent seizure  UPDATE Jul 13 2018: She overall is doing very well, there was no recurrent seizure tolerating Keppra 500 mg twice a day, has intermittent migraine headaches every 2 to 3 weeks, complains of nausea with Imitrex, taking ibuprofen as needed  works well for her most of the time  Update February 07, 2019 SS: She is having headaches right back of neck and forward, she says she feels the right side of her mouth draw to the side, unrelated to her headaches. She has had more headaches recently. These headaches have been new for the last few months. She may look in the mirror and see that the right side of her mouth is drawn, no other symptoms, her husband sees it as well. It twitches. It is ongoing, it may happen 3 days a week. It is there for 10 minutes. She has not had recurrent seizure. Maxalt not helping. She works for [employer].   Her brother passed away a few months ago, he has history single generalized seizure, unsure cause of death   REVIEW OF SYSTEMS: Out of a complete 14 system review of symptoms, the patient complains only of the following symptoms, and all other reviewed systems are negative.  Headache, seizures   ALLERGIES: Allergies  Allergen Reactions   Lamictal [Lamotrigine] Rash   Sumatriptan Nausea And Vomiting   Gadolinium Derivatives Rash    Pt had a rash across her chest following her injection of Multihance.  Rash assessed by Dr. Tyron RussellBoles, pt given 50mg  grams taken immediately and released with instructions to call if anything new arose.      HOME MEDICATIONS: Outpatient Medications Prior to Visit  Medication Sig Dispense Refill   ibuprofen (ADVIL,MOTRIN) 200 MG tablet Take 200 mg by mouth daily as needed.     levETIRAcetam (KEPPRA) 500 MG tablet Take 1 tablet (500 mg total) by  mouth 2 (two) times daily. 180 tablet 4   ondansetron (ZOFRAN) 4 MG tablet Take 4 mg by mouth every 8 (eight) hours as needed for nausea or vomiting.     pantoprazole (PROTONIX) 40 MG tablet Take 40 mg by mouth daily.     rizatriptan (MAXALT-MLT) 10 MG disintegrating tablet Take 1 tablet (10 mg total) by mouth as needed. May repeat in 2 hours if needed 15 tablet 6   No facility-administered medications prior to visit.      PAST MEDICAL HISTORY: No past medical history on file.  PAST SURGICAL HISTORY: No past surgical history on file.  FAMILY HISTORY: No family history on file.  SOCIAL HISTORY: Social History   Socioeconomic History   Marital status: Married    Spouse name: Not on file   Number of children: Not on file   Years of education: Not on file   Highest education level: Not on file  Occupational History   Not on file  Social Needs   Financial resource strain: Not on file   Food insecurity    Worry: Not on file    Inability: Not on file   Transportation needs    Medical: Not on file    Non-medical: Not on file  Tobacco Use   Smoking status: Former Smoker   Smokeless tobacco: Never Used  Substance and Sexual Activity   Alcohol use: Yes    Frequency: Never    Comment: 2-3 nights/wk   Drug use: No   Sexual activity: Not on file  Lifestyle   Physical activity    Days per week: Not on file    Minutes per session: Not on file   Stress: Not on file  Relationships   Social connections    Talks on phone: Not on file    Gets together: Not on file    Attends religious service: Not on file    Active member of club or organization: Not on file    Attends meetings of clubs or organizations: Not on file    Relationship status: Not on file   Intimate partner violence    Fear of current or ex partner: Not on file    Emotionally abused: Not on file    Physically abused: Not on file    Forced sexual activity: Not on file  Other Topics Concern   Not on file  Social History Narrative   Not on file    PHYSICAL EXAM  Vitals:   02/07/19 0939  BP: (!) 137/94  Pulse: (!) 104  Temp: 97.7 F (36.5 C)  Weight: 167 lb 12.8 oz (76.1 kg)  Height: 5\' 5"  (1.651 m)   Body mass index is 27.92 kg/m.  Generalized: Well developed, in no acute distress   Neurological examination  Mentation: Alert oriented to time, place, history taking. Follows all commands speech  and language fluent Cranial nerve II-XII: Pupils were equal round reactive to light. Extraocular movements were full, visual field were full on confrontational test. Facial sensation and strength were normal.  Head turning and shoulder shrug  were normal and symmetric. Motor: The motor testing reveals 5 over 5 strength of all 4 extremities. Good symmetric motor tone is noted throughout.  Sensory: Sensory testing is intact to soft touch on all 4 extremities. No evidence of extinction is noted.  Coordination: Cerebellar testing reveals good finger-nose-finger and heel-to-shin bilaterally.  Gait and station: Gait is normal. Tandem gait is normal. Romberg is negative. No drift is seen.  Reflexes: Deep tendon reflexes are symmetric and normal bilaterally.   DIAGNOSTIC DATA (LABS, IMAGING, TESTING) - I reviewed patient records, labs, notes, testing and imaging myself where available.  No results found for: WBC, HGB, HCT, MCV, PLT No results found for: NA, K, CL, CO2, GLUCOSE, BUN, CREATININE, CALCIUM, PROT, ALBUMIN, AST, ALT, ALKPHOS, BILITOT, GFRNONAA, GFRAA No results found for: CHOL, HDL, LDLCALC, LDLDIRECT, TRIG, CHOLHDL No results found for: JJHE1D No results found for: VITAMINB12 No results found for: TSH   ASSESSMENT AND PLAN 38 y.o. year old female  has no past medical history on file. here with:  1. Seizure, May 22, 2017 -symptoms of right side of mouth drawing/twisting to the right, suggestive of partial complex seizure -EEG showed mild background slowing Jan 2019 -Increase Keppra 500 mg in the morning, 1000 mg in the evening  -Repeat EEG -MRI of the brain January 2019 was normal  -Follow-up in 3 months with Dr. Terrace Arabia   2. Chronic migraine headache -Start nortriptyline 10 mg, titrating to 20 mg at bedtime   -Has tried Imitrex side effect of nausea -Continue Maxalt   I spent 25 minutes with the patient. 50% of this time was spent discussing her plan of care.   Margie Ege,  AGNP-C, DNP 02/07/2019, 9:55 AM [facility] [address], [address] [place], [place] [address] [phone]

## 2019-02-07 ENCOUNTER — Encounter: Payer: Self-pay | Admitting: Neurology

## 2019-02-07 ENCOUNTER — Ambulatory Visit: Payer: 59 | Admitting: Neurology

## 2019-02-07 ENCOUNTER — Other Ambulatory Visit: Payer: Self-pay

## 2019-02-07 VITALS — BP 137/94 | HR 104 | Temp 97.7°F | Ht 65.0 in | Wt 167.8 lb

## 2019-02-07 DIAGNOSIS — IMO0002 Reserved for concepts with insufficient information to code with codable children: Secondary | ICD-10-CM

## 2019-02-07 DIAGNOSIS — R569 Unspecified convulsions: Secondary | ICD-10-CM | POA: Diagnosis not present

## 2019-02-07 DIAGNOSIS — G43709 Chronic migraine without aura, not intractable, without status migrainosus: Secondary | ICD-10-CM

## 2019-02-07 MED ORDER — NORTRIPTYLINE HCL 10 MG PO CAPS: ORAL_CAPSULE | ORAL | 3 refills | Status: DC

## 2019-02-07 MED ORDER — LEVETIRACETAM 500 MG PO TABS: ORAL_TABLET | ORAL | 4 refills | Status: DC

## 2019-02-07 NOTE — Patient Instructions (Signed)
1. Increase Keppra 500 mg in the morning, 1000 mg in the evening  2. Start nortriptlyine 10 mg at bedtime x 1 week, increase to 2 capsules at bedtime  3. F/u 3 months

## 2019-02-07 NOTE — Progress Notes (Signed)
I have reviewed and agreed above plan.

## 2019-03-06 ENCOUNTER — Ambulatory Visit: Payer: 59

## 2019-03-06 ENCOUNTER — Other Ambulatory Visit: Payer: Self-pay

## 2019-03-14 ENCOUNTER — Other Ambulatory Visit: Payer: Self-pay

## 2019-03-14 DIAGNOSIS — Z20822 Contact with and (suspected) exposure to covid-19: Secondary | ICD-10-CM

## 2019-03-16 LAB — NOVEL CORONAVIRUS, NAA: SARS-CoV-2, NAA: DETECTED — AB

## 2019-04-03 ENCOUNTER — Other Ambulatory Visit: Payer: 59

## 2019-04-11 ENCOUNTER — Telehealth: Payer: Self-pay | Admitting: Neurology

## 2019-04-11 NOTE — Telephone Encounter (Signed)
lvm to r/s 1/11 appt

## 2019-04-19 ENCOUNTER — Other Ambulatory Visit: Payer: Self-pay

## 2019-04-19 ENCOUNTER — Ambulatory Visit: Payer: 59 | Admitting: Neurology

## 2019-04-19 DIAGNOSIS — R569 Unspecified convulsions: Secondary | ICD-10-CM | POA: Diagnosis not present

## 2019-04-27 NOTE — Procedures (Signed)
   HISTORY: 38 year old female, with history of seizure,  TECHNIQUE:  This is a routine 16 channel EEG recording with one channel devoted to a limited EKG recording.  It was performed during wakefulness, drowsiness and asleep.  Hyperventilation and photic stimulation were performed as activating procedures.  There are minimum muscle and movement artifact noted.  Upon maximum arousal, posterior dominant waking rhythm consistent of rhythmic alpha range activity, with frequency of 10 hz. Activities are symmetric over the bilateral posterior derivations and attenuated with eye opening.  Hyperventilation produced mild/moderate buildup with higher amplitude and the slower activities noted.  Photic stimulation did not alter the tracing.  During EEG recording, patient developed drowsiness and no deeper stage of sleep was achieved During EEG recording, there was no epileptiform discharge noted.  EKG demonstrate sinus rhythm, with heart rate of 90 bpm  CONCLUSION: This is a  normal EEG.  There is no electrodiagnostic evidence of epileptiform discharge.  Marcial Pacas, M.D. Ph.D.  [facility] [place], [place] [address] Phone: [phone] Fax:      [phone]

## 2019-05-01 ENCOUNTER — Telehealth: Payer: Self-pay

## 2019-05-01 NOTE — Telephone Encounter (Signed)
Called pt to go over her recent EEG results. NALVM.   "Please let the patient know her EEG was normal. There was no evidence of epileptiform discharge." -NP Butler Denmark.

## 2019-05-29 ENCOUNTER — Ambulatory Visit: Payer: 59 | Admitting: Neurology

## 2019-06-26 ENCOUNTER — Telehealth: Payer: Self-pay | Admitting: Neurology

## 2019-06-26 NOTE — Telephone Encounter (Addendum)
Erythromycin Ointment 5mg  TID   Valacyclovir HCL 1GM Tab TID

## 2019-06-26 NOTE — Telephone Encounter (Signed)
LMVM that she can leave the name of medication and I would be gld to ask about this.

## 2019-06-26 NOTE — Telephone Encounter (Signed)
I checked Micromedex, I see no interaction between the medications.

## 2019-06-26 NOTE — Telephone Encounter (Signed)
Pt states she went to a Dr to find out that she has an eye infection.  Pt was told before she starts the medication for that she is to speak with her Neurologist to see if it will conflict with her levETIRAcetam (KEPPRA) 500 MG tablet.  Please call

## 2019-06-26 NOTE — Telephone Encounter (Signed)
I called pt and LMVM for her that per our [facility] pharmacy app, she did not see any interaction between the medications.   She is to call back if questions.

## 2019-06-29 ENCOUNTER — Ambulatory Visit: Payer: 59 | Admitting: Neurology

## 2019-06-29 ENCOUNTER — Encounter: Payer: Self-pay | Admitting: Neurology

## 2019-06-29 ENCOUNTER — Other Ambulatory Visit: Payer: Self-pay

## 2019-06-29 VITALS — BP 143/96 | HR 93 | Ht 65.0 in | Wt 178.0 lb

## 2019-06-29 DIAGNOSIS — R569 Unspecified convulsions: Secondary | ICD-10-CM

## 2019-06-29 DIAGNOSIS — G43709 Chronic migraine without aura, not intractable, without status migrainosus: Secondary | ICD-10-CM

## 2019-06-29 DIAGNOSIS — IMO0002 Reserved for concepts with insufficient information to code with codable children: Secondary | ICD-10-CM

## 2019-06-29 MED ORDER — NORTRIPTYLINE HCL 10 MG PO CAPS: ORAL_CAPSULE | ORAL | 4 refills | Status: DC

## 2019-06-29 MED ORDER — LEVETIRACETAM 500 MG PO TABS: ORAL_TABLET | ORAL | 4 refills | Status: DC

## 2019-06-29 MED ORDER — RIZATRIPTAN BENZOATE 10 MG PO TBDP: 10.0000 mg | ORAL_TABLET | ORAL | 11 refills | Status: DC | PRN

## 2019-06-29 NOTE — Progress Notes (Signed)
PATIENT: Stacey Pena DOB: [birth year]  REASON FOR VISIT: follow up HISTORY FROM: patient  HISTORY OF PRESENT ILLNESS: Today 06/29/19  HISTORY  Memory Stacey Pena is a 39 year old female, accompanied by her husband, seen in refer by primary care doctor Stacey Pena, Stacey Pena, for evaluation of seizure,Initial evaluation was on May 31, 2017.  She was previously healthy, deny a personal family history of seizure  On May 22, 2017, while shopping with her daughter in the mall, without warning signs, she suddenly raised her right arm, sliding to the floor, then had a witnessed generalized tonic-clonic seizure, no incontinence, with right lateral tongue biting.  Seizure last about 2 minutes, [facility],  laboratory evaluations normal CBC, hemoglobin 12.9, CMP, creatinine 0.6, CT head without contrast showed no acute abnormality  She recently started a new job  She had frequent abdominal pain, GI symptoms since summer of 2018, MRI of abdomen in May 2018 showed multiple enhancing hepatic lesions in both lobes, measuring up to 2.8 cm, favoring the diagnosis of [place],  Update July 12, 2017:  MRI of the brain with and without contrast was normal on June 13, 2017.  EEG in January 2019 showed evidence of mild background slowing,  She has tried lamotrigine, cause rash, she is now taking Keppra 500 mg twice a day, tolerating it was normal well.  UPDATE December 30 2017: She complains of frequent right-sided headaches since July 2019, about 3-4 times each week, right retro-orbital, moderate pain, she tends to close her eyes which helps her headaches sound, oftentimes is getting worse as day goes on,  She has no recurrent seizure  UPDATE Jul 13 2018: She overall is doing very well, there was no recurrent seizure tolerating Keppra 500 mg twice a day, has intermittent migraine headaches every 2 to 3 weeks, complains of nausea with Imitrex, taking ibuprofen as  needed works well for her most of the time  UPDATE Jul 03 2019: She is doing very well, no recurrent seizure, taking keppra 500/1000mg ,her frequent migraine has improved, but still has intermittent sensitivity on the right side, complains of nausea with Imitrex,  REVIEW OF SYSTEMS: Out of a complete 14 system review of symptoms, the patient complains only of the following symptoms, and all other reviewed systems are negative.  Headache, seizures   ALLERGIES: Allergies  Allergen Reactions  . Lamictal [Lamotrigine] Rash  . Sumatriptan Nausea And Vomiting  . Gadolinium Derivatives Rash    Pt had a rash across her chest following her injection of Stacey Pena.  Rash assessed by Dr. Thornton Pena, pt given 50mg  grams taken immediately and released with instructions to call if anything new arose.      HOME MEDICATIONS: Outpatient Medications Prior to Visit  Medication Sig Dispense Refill  . ibuprofen (ADVIL,MOTRIN) 200 MG tablet Take 200 mg by mouth daily as needed.    . levETIRAcetam (KEPPRA) 500 MG tablet Take 1 tablet in the morning, take 2 in the evening 270 tablet 4  . nortriptyline (PAMELOR) 10 MG capsule Take I capsule at bedtime x 1 week, then take 2 at bedtime 60 capsule 3  . ondansetron (ZOFRAN) 4 MG tablet Take 4 mg by mouth every 8 (eight) hours as needed for nausea or vomiting.    . pantoprazole (PROTONIX) 40 MG tablet Take 40 mg by mouth daily.    . rizatriptan (MAXALT-MLT) 10 MG disintegrating tablet Take 1 tablet (10 mg total) by mouth as needed. May repeat in 2 hours if needed  15 tablet 6   No facility-administered medications prior to visit.    PAST MEDICAL HISTORY: History reviewed. No pertinent past medical history.  PAST SURGICAL HISTORY: History reviewed. No pertinent surgical history.  FAMILY HISTORY: History reviewed. No pertinent family history.  SOCIAL HISTORY: Social History   Socioeconomic History  . Marital status: Married    Spouse name: Not on file  .  Number of children: Not on file  . Years of education: Not on file  . Highest education level: Not on file  Occupational History  . Not on file  Tobacco Use  . Smoking status: Former Games developer  . Smokeless tobacco: Never Used  Substance and Sexual Activity  . Alcohol use: Yes    Pena: 2-3 nights/wk  . Drug use: No  . Sexual activity: Not on file  Other Topics Concern  . Not on file  Social History Narrative  . Not on file   Social Determinants of Health   Financial Resource Strain:   . Difficulty of Paying Living Expenses: Not on file  Food Insecurity:   . Worried About Programme researcher, broadcasting/film/video in the Last Year: Not on file  . Ran Out of Food in the Last Year: Not on file  Transportation Needs:   . Lack of Transportation (Medical): Not on file  . Lack of Transportation (Non-Medical): Not on file  Physical Activity:   . Days of Exercise per Week: Not on file  . Minutes of Exercise per Session: Not on file  Stress:   . Feeling of Stress : Not on file  Social Connections:   . Frequency of Communication with Friends and Family: Not on file  . Frequency of Social Gatherings with Friends and Family: Not on file  . Attends Religious Services: Not on file  . Active Member of Clubs or Organizations: Not on file  . Attends Banker Meetings: Not on file  . Marital Status: Not on file  Intimate Partner Violence:   . Fear of Current or Ex-Partner: Not on file  . Emotionally Abused: Not on file  . Physically Abused: Not on file  . Sexually Abused: Not on file    PHYSICAL EXAM  Vitals:   06/29/19 1020  BP: (!) 143/96  Pulse: 93  Weight: 178 lb (80.7 kg)  Height: 5\' 5"  (1.651 m)   Body mass index is 29.62 kg/m.   PHYSICAL EXAMNIATION:    NEUROLOGICAL EXAM:  MENTAL STATUS: Speech:    Speech is normal; fluent and spontaneous with normal comprehension.  Cognition:     Orientation to time, place and person     Normal recent and remote memory     Normal  Attention span and concentration     Normal Language, naming, repeating,spontaneous speech     Fund of knowledge   CRANIAL NERVES: CN II: Visual fields are full to confrontation.  Pupils are round equal and briskly reactive to light. CN III, IV, VI: extraocular movement are normal. No ptosis. CN V: Facial sensation is intact to pinprick in all 3 divisions bilaterally. Corneal responses are intact.  CN VII: Face is symmetric with normal eye closure and smile. CN VIII: Hearing is normal to casual conversation CN IX, X: Palate elevates symmetrically. Phonation is normal. CN XI: Head turning and shoulder shrug are intact CN XII: Tongue is midline with normal movements and no atrophy.  MOTOR: There is no pronator drift of out-stretched arms. Muscle bulk and tone are normal. Muscle strength is normal.  REFLEXES: Reflexes are 2+ and symmetric at the biceps, triceps, knees, and ankles. Plantar responses are flexor.  SENSORY: Intact to light touch, pinprick, positional and vibratory sensation are intact in fingers and toes.  COORDINATION: Rapid alternating movements and fine finger movements are intact. There is no dysmetria on finger-to-nose and heel-knee-shin.    GAIT/STANCE: Posture is normal. Gait is steady with normal steps, base, arm swing, and turning.    ASSESSMENT AND PLAN 39 y.o. year old female  has no past medical history on file.    Seizure, May 22, 2017 -symptoms of right side of mouth drawing/twisting to the right, suggestive of partial complex seizure -EEG showed mild background slowing Jan 2019 Keep Keppra 500 mg in the morning, 1000 mg in the evening  -Repeat EEG was normal on Apr 19 2019 -MRI of the brain January 2019 was normal     Chronic migraine headache Keep nortriptyline  20 mg at bedtime   Continue Maxalt prn  Levert Feinstein, M.D. Ph.D.  [facility] [address] [place], [place] [address] Phone: [phone] Fax:      [phone]

## 2020-01-01 ENCOUNTER — Ambulatory Visit: Payer: 59 | Admitting: Neurology

## 2020-01-09 ENCOUNTER — Encounter: Payer: Self-pay | Admitting: Neurology

## 2020-01-09 ENCOUNTER — Ambulatory Visit: Payer: 59 | Admitting: Neurology

## 2020-01-09 VITALS — BP 114/80 | HR 92 | Ht 65.0 in | Wt 175.0 lb

## 2020-01-09 DIAGNOSIS — R569 Unspecified convulsions: Secondary | ICD-10-CM | POA: Diagnosis not present

## 2020-01-09 DIAGNOSIS — G43709 Chronic migraine without aura, not intractable, without status migrainosus: Secondary | ICD-10-CM

## 2020-01-09 DIAGNOSIS — IMO0002 Reserved for concepts with insufficient information to code with codable children: Secondary | ICD-10-CM

## 2020-01-09 MED ORDER — TIZANIDINE HCL 2 MG PO CAPS: 2.0000 mg | ORAL_CAPSULE | Freq: Two times a day (BID) | ORAL | 1 refills | Status: DC | PRN

## 2020-01-09 NOTE — Patient Instructions (Signed)
Continue current medications Try tizanidine for muscle tension pain in your neck Try stretching, massage, heat for pain  Call for seizure  See you back in 6 months

## 2020-01-09 NOTE — Progress Notes (Signed)
PATIENT: Stacey Pena DOB: 1980/06/08  REASON FOR VISIT: follow up HISTORY FROM: patient  HISTORY OF PRESENT ILLNESS: Today 01/09/20  HISTORY  Kayleena Eke a 39 year old female, accompanied by her husband, seen in refer by primary care doctor Conley Rolls, Thao P, for evaluation of seizure,Initial evaluation was on May 31, 2017.  She was previously healthy, deny a personal family history of seizure  On May 22, 2017, while shopping with her daughter in the mall, without warning signs, she suddenly raised her right arm, sliding to the floor, then had a witnessed generalized tonic-clonic seizure, no incontinence, with right lateral tongue biting. Seizure last about 2 minutes, [facility],  laboratory evaluations normal CBC, hemoglobin 12.9, CMP, creatinine 0.6, CT head without contrast showed no acute abnormality  She recently started a new job  She had frequent abdominal pain, GI symptoms since summer of 2018, MRI of abdomen in May 2018 showed multiple enhancing hepatic lesions in both lobes, measuring up to 2.8 cm, favoring the diagnosis of FNH,  Update July 12, 2017:  MRI of the brain with and without contrast was normal on June 13, 2017.  EEG in January 2019 showed evidence of mild background slowing,  She has tried lamotrigine, cause rash, she is now taking Keppra 500 mg twice a day, tolerating it was normal well.  UPDATE December 30 2017: She complains of frequent right-sided headaches since July 2019, about 3-4 times each week, right retro-orbital, moderate pain, she tends to close her eyes which helps her headaches sound, oftentimes is getting worse as day goes on,  She has no recurrent seizure  UPDATE Jul 13 2018: She overall is doing very well, there was no recurrent seizure tolerating Keppra 500 mg twice a day, has intermittent migraine headaches every 2 to 3 weeks, complains of nausea with Imitrex, taking ibuprofen as  needed works well for her most of the time  UPDATE Jul 03 2019: She is doing very well, no recurrent seizure, taking keppra 500/1000mg ,her frequent migraine has improved, but still has intermittent sensitivity on the right side, complains of nausea with Imitrex,  Update January 09, 2020 SS: Doing overall well, no seizures, on Keppra 500/1000 mg, tolerating well, stopped nortriptyline, no longer having right-sided headache.  Has not had to take Maxalt.  For last several months, tightness to right neck, no radicular symptoms, feels like tension, doesn't cause occipital headache. Tried Administrator, sports, afraid going to triggered seizure.  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  Her father recently had new onset seizure.  REVIEW OF SYSTEMS: Out of a complete 14 system review of symptoms, the patient complains only of the following symptoms, and all other reviewed systems are negative.  See HPI  ALLERGIES: Allergies  Allergen Reactions  . Lamictal [Lamotrigine] Rash  . Sumatriptan Nausea And Vomiting  . Gadolinium Derivatives Rash    Pt had a rash across her chest following her injection of Multihance.  Rash assessed by Dr. Tyron Russell, pt given 50mg  grams taken immediately and released with instructions to call if anything new arose.      HOME MEDICATIONS: Outpatient Medications Prior to Visit  Medication Sig Dispense Refill  . ibuprofen (ADVIL,MOTRIN) 200 MG tablet Take 200 mg by mouth daily as needed.    . levETIRAcetam (KEPPRA) 500 MG tablet Take 1 tablet in the morning, take 2 in the evening 270 tablet 4  . ondansetron (ZOFRAN) 4 MG tablet Take 4  mg by mouth every 8 (eight) hours as needed for nausea or vomiting.    . pantoprazole (PROTONIX) 40 MG tablet Take 40 mg by mouth daily.    . rizatriptan (MAXALT-MLT) 10 MG disintegrating tablet Take 1 tablet (10 mg total) by mouth as needed. May repeat in 2 hours if needed 15 tablet 11  .  nortriptyline (PAMELOR) 10 MG capsule Take I capsule at bedtime x 1 week, then take 2 at bedtime 180 capsule 4   No facility-administered medications prior to visit.    PAST MEDICAL HISTORY: No past medical history on file.  PAST SURGICAL HISTORY: No past surgical history on file.  FAMILY HISTORY: No family history on file.  SOCIAL HISTORY: Social History   Socioeconomic History  . Marital status: Married    Spouse name: Not on file  . Number of children: Not on file  . Years of education: Not on file  . Highest education level: Not on file  Occupational History  . Not on file  Tobacco Use  . Smoking status: Former Games developer  . Smokeless tobacco: Never Used  Substance and Sexual Activity  . Alcohol use: Yes    Comment: 2-3 nights/wk  . Drug use: No  . Sexual activity: Not on file  Other Topics Concern  . Not on file  Social History Narrative  . Not on file   Social Determinants of Health   Financial Resource Strain:   . Difficulty of Paying Living Expenses: Not on file  Food Insecurity:   . Worried About Programme researcher, broadcasting/film/video in the Last Year: Not on file  . Ran Out of Food in the Last Year: Not on file  Transportation Needs:   . Lack of Transportation (Medical): Not on file  . Lack of Transportation (Non-Medical): Not on file  Physical Activity:   . Days of Exercise per Week: Not on file  . Minutes of Exercise per Session: Not on file  Stress:   . Feeling of Stress : Not on file  Social Connections:   . Frequency of Communication with Friends and Family: Not on file  . Frequency of Social Gatherings with Friends and Family: Not on file  . Attends Religious Services: Not on file  . Active Member of Clubs or Organizations: Not on file  . Attends Banker Meetings: Not on file  . Marital Status: Not on file  Intimate Partner Violence:   . Fear of Current or Ex-Partner: Not on file  . Emotionally Abused: Not on file  . Physically Abused: Not on file   . Sexually Abused: Not on file   PHYSICAL EXAM  Vitals:   01/09/20 0742  BP: 114/80  Pulse: 92  Weight: 175 lb (79.4 kg)  Height: 5\' 5"  (1.651 m)   Body mass index is 29.12 kg/m.  Generalized: Well developed, in no acute distress   Neurological examination  Mentation: Alert oriented to time, place, history taking. Follows all commands speech and language fluent Cranial nerve II-XII: Pupils were equal round reactive to light. Extraocular movements were full, visual field were full on confrontational test. Facial sensation and strength were normal. Head turning and shoulder shrug  were normal and symmetric. Motor: The motor testing reveals 5 over 5 strength of all 4 extremities. Good symmetric motor tone is noted throughout.  Sensory: Sensory testing is intact to soft touch on all 4 extremities. No evidence of extinction is noted.  Coordination: Cerebellar testing reveals good finger-nose-finger and heel-to-shin bilaterally.  Gait and station: Gait is normal.  Reflexes: Deep tendon reflexes are symmetric and normal bilaterally.   DIAGNOSTIC DATA (LABS, IMAGING, TESTING) - I reviewed patient records, labs, notes, testing and imaging myself where available.  No results found for: WBC, HGB, HCT, MCV, PLT No results found for: NA, K, CL, CO2, GLUCOSE, BUN, CREATININE, CALCIUM, PROT, ALBUMIN, AST, ALT, ALKPHOS, BILITOT, GFRNONAA, GFRAA No results found for: CHOL, HDL, LDLCALC, LDLDIRECT, TRIG, CHOLHDL No results found for: YTKZ6W No results found for: VITAMINB12 No results found for: TSH  ASSESSMENT AND PLAN 39 y.o. year old female  has no past medical history on file. here with:  1.  Seizure -No recurrent seizures since last seen -Symptoms of right side drawing of the mouth, suggestive of partial complex seizure -EEG showed mild background slowing January 2019, repeat EEG was normal in December 2020 -MRI of the brain January 2019 was normal -Continue Keppra 500/1000 mg  daily -Call for recurrent seizure, follow-up in 6 months or sooner if needed  2.  Chronic migraine headache -Stopped nortriptyline, no recent right-sided headache -Continue Maxalt as needed  3. Right neck pain, tension -seems muscular, no radicular symptoms -Try massage, heat, stretching, Aleve, given tizanidine to take PRN 2 mg for neck tension  I spent 30 minutes of face-to-face and non-face-to-face time with patient.  This included previsit chart review, lab review, study review, order entry, electronic health record documentation, patient education.  Margie Ege, AGNP-C, DNP 01/09/2020, 8:20 AM [facility] [address] [place], [place] [address] [phone]

## 2020-07-11 ENCOUNTER — Ambulatory Visit: Payer: 59 | Admitting: Neurology

## 2020-07-13 ENCOUNTER — Other Ambulatory Visit: Payer: Self-pay | Admitting: Neurology

## 2020-08-15 ENCOUNTER — Telehealth: Payer: Self-pay | Admitting: Neurology

## 2020-08-15 NOTE — Telephone Encounter (Signed)
I called pt and she would like to proceed and have a MRI done prior to her appt 09-26-20 with Dr. Terrace Arabia.  She states that tizanidine 2mg  po bid (she only takes at night due to drowsiness during day and has to work).  She states that her headaches are a little different she says since 12-2019 last seen, thinking migraine 2/ months.  R side head with sounds and vision/ nausea.  Takes advil, motrin.  I asked about rizatriptan and she did not know about that.  She is more stress since brother dying, and father having sz.  Wanted MRI to make sure she is ok,  Last one noted 2019.  I relayed that she may need to be see first due to MRI authorization but will ask.  Needs xanax for claustrophobia.

## 2020-08-15 NOTE — Telephone Encounter (Signed)
Pt called, wandering if I can schedule an MRI before my appt on 09/26/20. Would like a call from the nurse.

## 2020-08-15 NOTE — Telephone Encounter (Signed)
LMVM for pt that did send message to mychart, but will discuss MRI at follow up.  She is to call back if needed.

## 2020-08-15 NOTE — Telephone Encounter (Signed)
MRI of the brain with and without contrast in January 2019 was completely normal.  Based on description, Does not sound like any new symptoms to indicate MRI.  Can be ordered at follow-up visit if indicated.

## 2020-08-16 ENCOUNTER — Other Ambulatory Visit: Payer: Self-pay | Admitting: Neurology

## 2020-09-03 ENCOUNTER — Other Ambulatory Visit: Payer: Self-pay | Admitting: Neurology

## 2020-09-05 ENCOUNTER — Ambulatory Visit: Payer: 59 | Admitting: Neurology

## 2020-09-17 ENCOUNTER — Other Ambulatory Visit: Payer: Self-pay | Admitting: Neurology

## 2020-09-23 ENCOUNTER — Ambulatory Visit: Payer: 59 | Admitting: Neurology

## 2020-09-26 ENCOUNTER — Telehealth: Payer: Self-pay | Admitting: *Deleted

## 2020-09-26 ENCOUNTER — Ambulatory Visit: Payer: BC Managed Care – PPO | Admitting: Neurology

## 2020-09-26 ENCOUNTER — Encounter: Payer: Self-pay | Admitting: Neurology

## 2020-09-26 VITALS — BP 121/86 | HR 95 | Ht 65.0 in | Wt 175.0 lb

## 2020-09-26 DIAGNOSIS — R569 Unspecified convulsions: Secondary | ICD-10-CM

## 2020-09-26 DIAGNOSIS — G43709 Chronic migraine without aura, not intractable, without status migrainosus: Secondary | ICD-10-CM | POA: Diagnosis not present

## 2020-09-26 MED ORDER — LEVETIRACETAM 500 MG PO TABS: 1000.0000 mg | ORAL_TABLET | Freq: Two times a day (BID) | ORAL | 4 refills | Status: DC

## 2020-09-26 MED ORDER — VENLAFAXINE HCL ER 37.5 MG PO CP24: 37.5000 mg | ORAL_CAPSULE | Freq: Every day | ORAL | 11 refills | Status: DC

## 2020-09-26 MED ORDER — ZOLMITRIPTAN 5 MG PO TBDP: 5.0000 mg | ORAL_TABLET | ORAL | 6 refills | Status: DC | PRN

## 2020-09-26 NOTE — Telephone Encounter (Signed)
Zolmitriptan 5mg  PA started on covermymeds (key: [id number]). Pt has pharmacy coverage through [facility] [phone]). Decision pending.

## 2020-09-26 NOTE — Progress Notes (Signed)
ASSESSMENT AND PLAN 40 y.o. year old female   Seizure  Complex partial seizure, last witnessed was on Jan 5th 2019  That she reported recent few months, she had recurrent spells of waking up from sleep, body jerking, teeth clenching, her father had developed nocturnal seizure, her brother died from sleep for unknown reasons (he did have a history of seizure prior to that), the reported episode make her worry she might suffer nocturnal seizure,  Repeat EEG  Multiple previous regular EEG showed no significant abnormality, will proceed with prolonged video EEG monitoring  MRI of the brain with without contrast in January 2019 was normal  Will increase Keppra 500 mg to 2 tablets twice a day   Chronic migraine headache  Frequent headaches, couple times each week,  Suboptimal response to Imitrex, Maxalt, will try Zomig 5 mg dissolvable as needed, may combine with Aleve, Zofran for prolonged severe headaches,  Anxiety  Start Effexor XR 37.5 mg, also serve as a migraine prevention  DIAGNOSTIC DATA (LABS, IMAGING, TESTING) - I reviewed patient records, labs, notes, testing and imaging myself where available.   HISTORY OF PRESENT ILLNESS:  Stacey Pena a 40 year old female, accompanied by her husband, seen in refer by primary care doctor Conley Rolls, Thao P, for evaluation of seizure,Initial evaluation was on May 31, 2017.  She was previously healthy, deny a personal family history of seizure  On May 22, 2017, while shopping with her daughter in the mall, without warning signs, she suddenly raised her right arm, sliding to the floor, then had a witnessed generalized tonic-clonic seizure, no incontinence, with right lateral tongue biting. Seizure last about 2 minutes, [facility],  laboratory evaluations normal CBC, hemoglobin 12.9, CMP, creatinine 0.6, CT head without contrast showed no acute abnormality  She recently started a new job  She had  frequent abdominal pain, GI symptoms since summer of 2018, MRI of abdomen in May 2018 showed multiple enhancing hepatic lesions in both lobes, measuring up to 2.8 cm, favoring the diagnosis of FNH,  Update July 12, 2017:  MRI of the brain with and without contrast was normal on June 13, 2017.  EEG in January 2019 showed evidence of mild background slowing,  She has tried lamotrigine, cause rash, she is now taking Keppra 500 mg twice a day, tolerating it was normal well.  UPDATE December 30 2017: She complains of frequent right-sided headaches since July 2019, about 3-4 times each week, right retro-orbital, moderate pain, she tends to close her eyes which helps her headaches sound, oftentimes is getting worse as day goes on,  She has no recurrent seizure  UPDATE Jul 13 2018: She overall is doing very well, there was no recurrent seizure tolerating Keppra 500 mg twice a day, has intermittent migraine headaches every 2 to 3 weeks, complains of nausea with Imitrex, taking ibuprofen as needed works well for her most of the time  UPDATE Jul 03 2019: She is doing very well, no recurrent seizure, taking keppra 500/1000mg ,her frequent migraine has improved, but still has intermittent sensitivity on the right side, complains of nausea with Imitrex,  UPDATE Sep 26 2020:  She complains of excessive stress recently, increased migraine to couple times each month, suboptimal response to Maxalt, which often make her feel tired, previously did not respond well to Imitrex, will try Zomig at this time  Her father developed nocturnal seizure in his 67s, she has a brother died from sleep for unknown reasons, he  did have a spell with tongue biting motor vehicle accident prior to sudden pass away.  Patient reported in the past few months, couple times each week, she woke up from sleep felt body jerking, tight, teeth clenching, those episode make her worried about nocturnal seizure, desire further  evaluation, she is now taking Keppra 500/1000 mg   PHYSICAL EXAM  Vitals:   09/26/20 0726  BP: 121/86  Pulse: 95  Weight: 175 lb (79.4 kg)  Height: 5\' 5"  (1.651 m)   Body mass index is 29.12 kg/m.  Generalized: Well developed, in no acute distress    PHYSICAL EXAMNIATION:  Gen: NAD, conversant, well nourised, well groomed                     Cardiovascular: Regular rate rhythm, no peripheral edema, warm, nontender. Eyes: Conjunctivae clear without exudates or hemorrhage Neck: Supple, no carotid bruits. Pulmonary: Clear to auscultation bilaterally   NEUROLOGICAL EXAM:  MENTAL STATUS: Speech/Cognition: Awake, alert, normal speech, oriented to history taking and casual conversation.  CRANIAL NERVES: CN II: Visual fields are full to confrontation.  Pupils are round equal and briskly reactive to light. CN III, IV, VI: extraocular movement are normal. No ptosis. CN V: Facial sensation is intact to light touch. CN VII: Face is symmetric with normal eye closure and smile. CN VIII: Hearing is normal to casual conversation CN IX, X: Palate elevates symmetrically. Phonation is normal. CN XI: Head turning and shoulder shrug are intact CN XII: Tongue is midline with normal movements and no atrophy.  MOTOR: Muscle bulk and tone are normal. Muscle strength is normal.  REFLEXES: Reflexes are 2  and symmetric at the biceps, triceps, knees and ankles. Plantar responses are flexor.  SENSORY: Intact to light touch, pinprick, positional and vibratory sensation at fingers and toes.  COORDINATION: There is no trunk or limb ataxia.    GAIT/STANCE: Posture is normal. Gait is steady with normal steps, base, arm swing and turning.    REVIEW OF SYSTEMS: Out of a complete 14 system review of symptoms, the patient complains only of the following symptoms, and all other reviewed systems are negative.  See HPI  ALLERGIES: Allergies  Allergen Reactions  . Lamictal [Lamotrigine] Rash   . Sumatriptan Nausea And Vomiting  . Gadolinium Derivatives Rash    Pt had a rash across her chest following her injection of Multihance.  Rash assessed by Dr. , pt given 50mg  grams taken immediately and released with instructions to call if anything new arose.      HOME MEDICATIONS: Outpatient Medications Prior to Visit  Medication Sig Dispense Refill  . ibuprofen (ADVIL,MOTRIN) 200 MG tablet Take 200 mg by mouth daily as needed.    . levETIRAcetam (KEPPRA) 500 MG tablet TAKE 1 TABLET BY MOUTH IN THE MORNING, TAKE 2 TABLETS IN THE EVENING 270 tablet 0  . ondansetron (ZOFRAN) 4 MG tablet Take 4 mg by mouth every 8 (eight) hours as needed for nausea or vomiting.    . pantoprazole (PROTONIX) 40 MG tablet Take 40 mg by mouth as needed.    . rizatriptan (MAXALT-MLT) 10 MG disintegrating tablet Take 1 tablet (10 mg total) by mouth as needed. May repeat in 2 hours if needed 15 tablet 11  . tizanidine (ZANAFLEX) 2 MG capsule Take 1 capsule (2 mg total) by mouth 2 (two) times daily as needed for muscle spasms. 20 capsule 1   No facility-administered medications prior to visit.    PAST MEDICAL HISTORY:  History reviewed. No pertinent past medical history.  PAST SURGICAL HISTORY: History reviewed. No pertinent surgical history.  FAMILY HISTORY: History reviewed. No pertinent family history.  SOCIAL HISTORY: Social History   Socioeconomic History  . Marital status: Married    Spouse name: Not on file  . Number of children: Not on file  . Years of education: Not on file  . Highest education level: Not on file  Occupational History  . Not on file  Tobacco Use  . Smoking status: Former Games developer  . Smokeless tobacco: Never Used  Substance and Sexual Activity  . Alcohol use: Yes    Comment: 2-3 nights/wk  . Drug use: No  . Sexual activity: Not on file  Other Topics Concern  . Not on file  Social History Narrative  . Not on file   Social Determinants of Health   Financial  Resource Strain: Not on file  Food Insecurity: Not on file  Transportation Needs: Not on file  Physical Activity: Not on file  Stress: Not on file  Social Connections: Not on file  Intimate Partner Violence: Not on file    Levert Feinstein, M.D. Ph.D.  [facility] [address] [place], [place] [address] Phone: [phone] Fax:      [phone]

## 2020-09-26 NOTE — Telephone Encounter (Signed)
Signed MD orders faxed and confirmed for 120hr EEG. Sent to [facility] - ph:[phone], fax:[phone].

## 2020-09-30 NOTE — Telephone Encounter (Signed)
Our office received a fax for additional questions. I called [employer] at [phone] and provided the answers verbally to rep, Inetta Fermo. This was in effort to speed up the determination. The case should be reviewed within 24-48 hours.

## 2020-10-01 ENCOUNTER — Other Ambulatory Visit: Payer: Self-pay

## 2020-10-01 ENCOUNTER — Other Ambulatory Visit (HOSPITAL_BASED_OUTPATIENT_CLINIC_OR_DEPARTMENT_OTHER): Payer: Self-pay

## 2020-10-01 ENCOUNTER — Encounter (HOSPITAL_BASED_OUTPATIENT_CLINIC_OR_DEPARTMENT_OTHER): Payer: Self-pay | Admitting: Emergency Medicine

## 2020-10-01 ENCOUNTER — Emergency Department (HOSPITAL_BASED_OUTPATIENT_CLINIC_OR_DEPARTMENT_OTHER): Payer: BC Managed Care – PPO

## 2020-10-01 ENCOUNTER — Emergency Department (HOSPITAL_BASED_OUTPATIENT_CLINIC_OR_DEPARTMENT_OTHER)
Admission: EM | Admit: 2020-10-01 | Discharge: 2020-10-01 | Disposition: A | Discharge status: Home / Self Care | Payer: BC Managed Care – PPO | Attending: Emergency Medicine | Admitting: Emergency Medicine

## 2020-10-01 DIAGNOSIS — R0602 Shortness of breath: Secondary | ICD-10-CM | POA: Diagnosis not present

## 2020-10-01 DIAGNOSIS — Z20822 Contact with and (suspected) exposure to covid-19: Secondary | ICD-10-CM | POA: Insufficient documentation

## 2020-10-01 DIAGNOSIS — R0789 Other chest pain: Secondary | ICD-10-CM | POA: Insufficient documentation

## 2020-10-01 DIAGNOSIS — R079 Chest pain, unspecified: Secondary | ICD-10-CM | POA: Diagnosis not present

## 2020-10-01 DIAGNOSIS — E876 Hypokalemia: Secondary | ICD-10-CM

## 2020-10-01 DIAGNOSIS — R002 Palpitations: Secondary | ICD-10-CM

## 2020-10-01 DIAGNOSIS — Z87891 Personal history of nicotine dependence: Secondary | ICD-10-CM | POA: Diagnosis not present

## 2020-10-01 HISTORY — DX: Unspecified convulsions: R56.9

## 2020-10-01 LAB — D-DIMER, QUANTITATIVE: D-Dimer, Quant: 0.27 ug/mL-FEU (ref 0.00–0.50)

## 2020-10-01 LAB — TROPONIN I (HIGH SENSITIVITY)
Troponin I (High Sensitivity): 43 ng/L — ABNORMAL HIGH (ref <18)
Troponin I (High Sensitivity): 43 ng/L — ABNORMAL HIGH (ref <18)

## 2020-10-01 LAB — BASIC METABOLIC PANEL
Anion gap: 12 (ref 5–15)
BUN: 9 mg/dL (ref 6–20)
CO2: 22 mmol/L (ref 22–32)
Calcium: 8.4 mg/dL — ABNORMAL LOW (ref 8.9–10.3)
Chloride: 101 mmol/L (ref 98–111)
Creatinine, Ser: 0.49 mg/dL (ref 0.44–1.00)
GFR, Estimated: >60 mL/min (ref >60)
Glucose, Bld: 114 mg/dL — ABNORMAL HIGH (ref 70–99)
Potassium: 2.9 mmol/L — ABNORMAL LOW (ref 3.5–5.1)
Sodium: 135 mmol/L (ref 135–145)

## 2020-10-01 LAB — CBC
HCT: 35.1 % — ABNORMAL LOW (ref 36.0–46.0)
Hemoglobin: 12.3 g/dL (ref 12.0–15.0)
MCH: 36.4 pg — ABNORMAL HIGH (ref 26.0–34.0)
MCHC: 35 g/dL (ref 30.0–36.0)
MCV: 103.8 fL — ABNORMAL HIGH (ref 80.0–100.0)
Platelets: 195 10*3/uL (ref 150–400)
RBC: 3.38 MIL/uL — ABNORMAL LOW (ref 3.87–5.11)
RDW: 13.2 % (ref 11.5–15.5)
WBC: 4.2 10*3/uL (ref 4.0–10.5)
nRBC: 0 % (ref 0.0–0.2)

## 2020-10-01 LAB — RESP PANEL BY RT-PCR (FLU A&B, COVID) ARPGX2
Influenza A by PCR: NEGATIVE
Influenza B by PCR: NEGATIVE
SARS Coronavirus 2 by RT PCR: NEGATIVE

## 2020-10-01 LAB — MAGNESIUM: Magnesium: 1.8 mg/dL (ref 1.7–2.4)

## 2020-10-01 MED ORDER — ZOLMITRIPTAN 5 MG PO TABS: ORAL_TABLET | ORAL | 5 refills | Status: DC

## 2020-10-01 MED ORDER — POTASSIUM CHLORIDE 10 MEQ/100ML IV SOLN
10.0000 meq | INTRAVENOUS | Status: AC
  Administered 2020-10-01 (×2): 10 meq via INTRAVENOUS
  Filled 2020-10-01 (×2): qty 100

## 2020-10-01 MED ORDER — POTASSIUM CHLORIDE ER 10 MEQ PO TBCR
10.0000 meq | EXTENDED_RELEASE_TABLET | Freq: Every day | ORAL | 1 refills | Status: DC
  Filled 2020-10-01: qty 30, 30d supply, fill #0
  Filled 2020-10-26: qty 30, 30d supply, fill #1

## 2020-10-01 NOTE — Telephone Encounter (Signed)
PA denied for zolmitriptan ODT 5mg  when patient is able to swallow tablets. This is despite giving her plan the information that patients may vomit with migraines. New rx for the same dosage and directions sent to pharmacy in tablet form.

## 2020-10-01 NOTE — Discharge Instructions (Addendum)
Instructions.  You were seen in the emergency department for an episode of chest pain, palpitations, and shortness of breath yesterday.  Based on your medical work-up, I think it is most likely that you experienced an arrhythmia, or a fast heart rate rhythm.  This can be caused by a lot of different conditions. I included information for you to read over regarding SVTs.  We discussed her low potassium level.  This can be a trigger for these arrhythmias.  We gave you IV potassium, and I started you on potassium pills to take every day.  Please read over the food instructions and consider adding more potassium to your diet.  I talked to our cardiologist.  They will be mailing you a heart monitor, which you can set up and begin wearing at home.  If you have more heart events, they will be able to monitor this.  They will also reach out to you about follow-up in the office.  It is very important that you see a cardiologist for these issues.  You may need further testing of your heart.  *  If you experience another episode repeated to feel very lightheaded, short of breath, or have chest pain or pressure, please come back to the ER immediately.

## 2020-10-01 NOTE — ED Triage Notes (Signed)
Pt arrives pov with c/o mid sternal CP, shob and N/v that started last night after dinner. Pt sent by PCP for abnormal EKG. Pt denies CP at this time.

## 2020-10-01 NOTE — Addendum Note (Signed)
Addended by: Lindell Spar C on: 10/01/2020 11:26 AM   Modules accepted: Orders

## 2020-10-01 NOTE — ED Provider Notes (Signed)
[facility] EMERGENCY DEPARTMENT Provider Note   CSN: [id number] Arrival date & time: 10/01/20  1212     History CC:  Palpitations, shortness of breath  Stacey Pena is a 40 y.o. female with a history of migraines, seizures on Keppra daily, presenting to the emergency department with complaint for chest pressure and shortness of breath.  The patient reports that she completed dinner with her family yesterday when they went out to eat, and on her way back to the car had abrupt onset of shortness of breath and chest pressure.  She feels that her heart was racing, although she does not know her heart rate.  She feels like the symptoms lasted approximately 2 to 3 hours, and then gradually abated as she laid down for bed at night.  She had some mild chest pressure when she woke up today, but is feeling overall much better.  She has never had these symptoms before.     She does have a history of reflux and takes Protonix for that, but this feels different.  She was seen by her PCP today and an EKG in the office, was told to come to the ED for further evaluation as this may be abnormal.  She reports some nausea.  She denies vomiting.  She has not had a seizure in 3 years and has been compliant with her medications, Keppra 1000 mg twice a day.  She reports a family history of Wolff-Parkinson-White in her brother, who died in his sleep of unclear cause.  She herself has never been diagnosed with any cardiomyopathy or channel apathy, and has never seen a cardiologist.  She has had 2 doses of the COVID-vaccine, no booster.  There are no sick contacts in the house.  No hemoptysis or asymmetric LE edema. Patient denies personal or family history of DVT or PE. No recent hormone use (including OCP); travel for >6 hours; surgeries or trauma in the last 4 weeks; or malignancy with treatment within 6 months.   HPI     Past Medical History:  Diagnosis Date  . Seizures [facility])     Patient  Active Problem List   Diagnosis Date Noted  . Chronic migraine w/o aura w/o status migrainosus, not intractable 12/30/2017  . Seizures (HCC) 05/31/2017    History reviewed. No pertinent surgical history.   OB History   No obstetric history on file.     History reviewed. No pertinent family history.  Social History   Tobacco Use  . Smoking status: Former Games developer  . Smokeless tobacco: Never Used  Substance Use Topics  . Alcohol use: Yes    Comment: 2-3 nights/wk  . Drug use: No    Home Medications Prior to Admission medications   Medication Sig Start Date End Date Taking? Authorizing Provider  potassium chloride (KLOR-CON) 10 MEQ tablet Take 1 tablet (10 mEq total) by mouth daily. 10/01/20 10/31/20 Yes Nikya Busler, Kermit Balo, MD  ibuprofen (ADVIL,MOTRIN) 200 MG tablet Take 200 mg by mouth daily as needed.    [provider]  levETIRAcetam (KEPPRA) 500 MG tablet Take 2 tablets (1,000 mg total) by mouth 2 (two) times daily. 09/26/20   Levert Feinstein, MD  ondansetron (ZOFRAN) 4 MG tablet Take 4 mg by mouth every 8 (eight) hours as needed for nausea or vomiting.    [provider]  pantoprazole (PROTONIX) 40 MG tablet Take 40 mg by mouth as needed.    [provider]  venlafaxine XR (EFFEXOR XR) 37.5 MG  24 hr capsule Take 1 capsule (37.5 mg total) by mouth daily with breakfast. 09/26/20   Levert FeinsteinYan, Yijun, MD  zolmitriptan (ZOMIG) 5 MG tablet Take 1 tab at onset of migraine.  May repeat in 2 hrs, if needed.  Max dose: 2 tabs/day. This is a 30 day prescription. 10/01/20   Levert FeinsteinYan, Yijun, MD    Allergies    Lamictal [lamotrigine], Sumatriptan, and Gadolinium derivatives  Review of Systems   Review of Systems  Constitutional: Negative for chills and fever.  HENT: Negative for ear pain and sore throat.   Eyes: Negative for pain and visual disturbance.  Respiratory: Positive for cough and shortness of breath.   Cardiovascular: Positive for chest pain. Negative for palpitations.   Gastrointestinal: Negative for abdominal pain, nausea and vomiting.  Genitourinary: Negative for dysuria and hematuria.  Musculoskeletal: Negative for arthralgias and back pain.  Skin: Negative for color change and rash.  Neurological: Negative for syncope and headaches.  All other systems reviewed and are negative.   Physical Exam Updated Vital Signs BP 127/90   Pulse 85   Temp 98.8 F (37.1 C) (Oral)   Resp 18   Ht 5\' 5"  (1.651 m)   Wt 79.4 kg   LMP 08/02/2020   SpO2 97%   BMI 29.12 kg/m   Physical Exam Constitutional:      General: She is not in acute distress. HENT:     Head: Normocephalic and atraumatic.  Eyes:     Conjunctiva/sclera: Conjunctivae normal.     Pupils: Pupils are equal, round, and reactive to light.  Cardiovascular:     Rate and Rhythm: Normal rate and regular rhythm.     Pulses: Normal pulses.  Pulmonary:     Effort: Pulmonary effort is normal. No respiratory distress.  Abdominal:     General: There is no distension.     Tenderness: There is no abdominal tenderness.  Skin:    General: Skin is warm and dry.  Neurological:     General: No focal deficit present.     Mental Status: She is alert. Mental status is at baseline.  Psychiatric:        Mood and Affect: Mood normal.        Behavior: Behavior normal.     ED Results / Procedures / Treatments   Labs (all labs ordered are listed, but only abnormal results are displayed) Labs Reviewed  BASIC METABOLIC PANEL - Abnormal; Notable for the following components:      Result Value   Potassium 2.9 (*)    Glucose, Bld 114 (*)    Calcium 8.4 (*)    All other components within normal limits  CBC - Abnormal; Notable for the following components:   RBC 3.38 (*)    HCT 35.1 (*)    MCV 103.8 (*)    MCH 36.4 (*)    All other components within normal limits  TROPONIN I (HIGH SENSITIVITY) - Abnormal; Notable for the following components:   Troponin I (High Sensitivity) 43 (*)    All other  components within normal limits  TROPONIN I (HIGH SENSITIVITY) - Abnormal; Notable for the following components:   Troponin I (High Sensitivity) 43 (*)    All other components within normal limits  RESP PANEL BY RT-PCR (FLU A&B, COVID) ARPGX2  D-DIMER, QUANTITATIVE  MAGNESIUM    EKG EKG Interpretation  Date/Time:  Tuesday Oct 01 2020 12:27:31 EDT Ventricular Rate:  106 PR Interval:  142 QRS Duration: 92 QT Interval:  344 QTC Calculation: 456 R Axis:   118 Text Interpretation: Sinus tachycardia Right axis deviation Incomplete right bundle branch block Abnormal ECG Confirmed by Alvester Chou [phone]) on 10/01/2020 1:07:19 PM   Radiology DG Chest 2 View  Result Date: 10/01/2020 CLINICAL DATA:  Chest pain and shortness of breath since last evening. EXAM: CHEST - 2 VIEW COMPARISON:  None. FINDINGS: The cardiac silhouette, mediastinal and hilar contours are within normal limits. The lungs are clear of an acute process. No pleural effusions or pulmonary lesions. The bony thorax is intact. There is a moderate pectus deformity noted. IMPRESSION: No acute cardiopulmonary findings. Electronically Signed   By: Rudie Meyer M.D.   On: 10/01/2020 13:24    Procedures .Critical Care Performed by: Terald Sleeper, MD Authorized by: Terald Sleeper, MD   Critical care provider statement:    Critical care time (minutes):  35   Critical care was necessary to treat or prevent imminent or life-threatening deterioration of the following conditions:  Metabolic crisis   Critical care was time spent personally by me on the following activities:  Discussions with consultants, evaluation of patient's response to treatment, examination of patient, ordering and performing treatments and interventions, ordering and review of laboratory studies, ordering and review of radiographic studies, pulse oximetry, re-evaluation of patient's condition, obtaining history from patient or surrogate and review of old  charts Comments:     Hypokalemia and suspected arrhythmia requiring IV K repletion     Medications Ordered in ED Medications  potassium chloride 10 mEq in 100 mL IVPB (0 mEq Intravenous Stopped 10/01/20 1627)    ED Course  I have reviewed the triage vital signs and the nursing notes.  Pertinent labs & imaging results that were available during my care of the patient were reviewed by me and considered in my medical decision making (see chart for details).  This patient complains of an episode of palpitations, lightheadedness, SOB yesterday evening, now resolved. This involves an extensive number of treatment options, and is a complaint that carries with it a high risk of complications and morbidity.  The differential diagnosis includes arrhythmia vs PE vs anemia vs other  I ordered, reviewed, and interpreted labs.  K 2.9.  IV K ordered for repletion I ordered imaging studies which included dg chest I independently visualized and interpreted imaging which showed no life-threatening abnormalities, and the monitor tracing which showed NSR Additional history was obtained from patient and her husband  Covid/flu negative Delta troponin mildly elevated 43, but flat on repeat.  I suspect this may be related to some demand ischemia from an SVT last night.  No immediate risk factors for ACS, and HEART score is 1.  Doubt this is ACS.  Ddimer negative, and she is asymptomatic.  I would suspect persistent symptoms or elevated Ddimer if a large PE was causing her episode or responsible for her troponin elevation and heart strain.  After the interventions stated above, I reevaluated the patient and found she remained asymptomatic in normal sinus rhythm.  I explained the workup and need for close cardiology f/u, particularly with her family hx of WPW.  Also discussed return precautions.    Clinical Course as of 10/01/20 1754  Tue Oct 01, 2020  1534 Delta trop is negative.  I spoke to Dr Duke Salvia from  cardiology who agrees that this may be an arrhythmia, and is advised that she will mail the patient a cardiac monitor, and also arrange for office follow-up.  The patient  remains asymptomatic at this time.  I reviewed the entire work-up.  We discussed starting her on potassium at home, which may help reduce her risk of arrhythmia.  We also discussed close return precautions.  I discussed with the patient and her husband at bedside.  They verbalized understanding.  I think they are stable for discharge [MT]    Clinical Course User Index [MT] Marjoria Mancillas, Kermit Balo, MD    Final Clinical Impression(s) / ED Diagnoses Final diagnoses:  Hypokalemia  Shortness of breath  Palpitations    Rx / DC Orders ED Discharge Orders         Ordered    Ambulatory referral to Cardiology       Comments: Arrhythmia evaluation - family hx of WPW   10/01/20 1513    potassium chloride (KLOR-CON) 10 MEQ tablet  Daily        10/01/20 1602           Terald Sleeper, MD 10/01/20 1756

## 2020-10-02 ENCOUNTER — Ambulatory Visit (INDEPENDENT_AMBULATORY_CARE_PROVIDER_SITE_OTHER): Payer: BC Managed Care – PPO

## 2020-10-02 ENCOUNTER — Other Ambulatory Visit: Payer: Self-pay | Admitting: Physician Assistant

## 2020-10-02 DIAGNOSIS — R002 Palpitations: Secondary | ICD-10-CM

## 2020-10-02 NOTE — Progress Notes (Unsigned)
Patient enrolled for [employer] to mail a 7 day ZIO XT long term holter monitor to her home. 

## 2020-10-05 DIAGNOSIS — R002 Palpitations: Secondary | ICD-10-CM | POA: Diagnosis not present

## 2020-10-16 DIAGNOSIS — R002 Palpitations: Secondary | ICD-10-CM | POA: Diagnosis not present

## 2020-10-25 ENCOUNTER — Other Ambulatory Visit: Payer: Self-pay

## 2020-10-25 ENCOUNTER — Encounter: Payer: Self-pay | Admitting: Cardiology

## 2020-10-25 ENCOUNTER — Ambulatory Visit: Payer: BC Managed Care – PPO | Admitting: Cardiology

## 2020-10-25 VITALS — BP 108/80 | HR 96 | Ht 65.0 in | Wt 170.8 lb

## 2020-10-25 DIAGNOSIS — Z13220 Encounter for screening for lipoid disorders: Secondary | ICD-10-CM

## 2020-10-25 DIAGNOSIS — E876 Hypokalemia: Secondary | ICD-10-CM

## 2020-10-25 DIAGNOSIS — R079 Chest pain, unspecified: Secondary | ICD-10-CM | POA: Diagnosis not present

## 2020-10-25 DIAGNOSIS — R9431 Abnormal electrocardiogram [ECG] [EKG]: Secondary | ICD-10-CM

## 2020-10-25 DIAGNOSIS — R002 Palpitations: Secondary | ICD-10-CM

## 2020-10-25 LAB — TSH: TSH: 2.19 u[IU]/mL (ref 0.450–4.500)

## 2020-10-25 LAB — COMPREHENSIVE METABOLIC PANEL
ALT: 28 IU/L (ref 0–32)
AST: 25 IU/L (ref 0–40)
Albumin/Globulin Ratio: 1.8 (ref 1.2–2.2)
Albumin: 4.6 g/dL (ref 3.8–4.8)
Alkaline Phosphatase: 48 IU/L (ref 44–121)
BUN/Creatinine Ratio: 10 (ref 9–23)
BUN: 6 mg/dL (ref 6–20)
Bilirubin Total: 0.7 mg/dL (ref 0.0–1.2)
CO2: 19 mmol/L — ABNORMAL LOW (ref 20–29)
Calcium: 9.5 mg/dL (ref 8.7–10.2)
Chloride: 96 mmol/L (ref 96–106)
Creatinine, Ser: 0.61 mg/dL (ref 0.57–1.00)
Globulin, Total: 2.5 g/dL (ref 1.5–4.5)
Glucose: 107 mg/dL — ABNORMAL HIGH (ref 65–99)
Potassium: 3.4 mmol/L — ABNORMAL LOW (ref 3.5–5.2)
Sodium: 134 mmol/L (ref 134–144)
Total Protein: 7.1 g/dL (ref 6.0–8.5)
eGFR: 117 mL/min/{1.73_m2} (ref >59)

## 2020-10-25 LAB — LIPID PANEL
Chol/HDL Ratio: 4.3 ratio (ref 0.0–4.4)
Cholesterol, Total: 269 mg/dL — ABNORMAL HIGH (ref 100–199)
HDL: 63 mg/dL (ref >39)
LDL Chol Calc (NIH): 92 mg/dL (ref 0–99)
Triglycerides: 686 mg/dL (ref 0–149)
VLDL Cholesterol Cal: 114 mg/dL — ABNORMAL HIGH (ref 5–40)

## 2020-10-25 LAB — MAGNESIUM: Magnesium: 2.1 mg/dL (ref 1.6–2.3)

## 2020-10-25 NOTE — Patient Instructions (Signed)
Medication Instructions:  Your physician recommends that you continue on your current medications as directed. Please refer to the Current Medication list given to you today.  *If you need a refill on your cardiac medications before your next appointment, please call your pharmacy*   Lab Work: CMET, Mag, Lipid, TSH today  If you have labs (blood work) drawn today and your tests are completely normal, you will receive your results only by: MyChart Message (if you have MyChart) OR A paper copy in the mail If you have any lab test that is abnormal or we need to change your treatment, we will call you to review the results.   Testing/Procedures: Your physician has requested that you have an exercise tolerance test. For further information please visit [email or web address] Please also follow instruction sheet, as given.  Your physician has requested that you have an echocardiogram. Echocardiography is a painless test that uses sound waves to create images of your heart. It provides your doctor with information about the size and shape of your heart and how well your heart's chambers and valves are working. This procedure takes approximately one hour. There are no restrictions for this procedure.  This will be done at our [facility] location:  [address]  Follow-Up: At [facility], you and your health needs are our priority.  As part of our continuing mission to provide you with exceptional heart care, we have created designated Provider Care Teams.  These Care Teams include your primary Cardiologist (physician) and Advanced Practice Providers (APPs -  Physician Assistants and Nurse Practitioners) who all work together to provide you with the care you need, when you need it.  We recommend signing up for the patient portal called "MyChart".  Sign up information is provided on this After Visit Summary.  MyChart is used to connect with patients for Virtual Visits  (Telemedicine).  Patients are able to view lab/test results, encounter notes, upcoming appointments, etc.  Non-urgent messages can be sent to your provider as well.   To learn more about what you can do with MyChart, go to [email or web address].    Your next appointment:   3 month(s)  The format for your next appointment:   In Person  Provider:   Epifanio Lesches, MD   Other Instructions Recommend: [facility]

## 2020-10-25 NOTE — Progress Notes (Signed)
Cardiology Office Note:    Date:  10/25/2020   ID:  [name], DOB September 12, 1980, MRN [id number]  PCP:  Lenell Antu, DO  Cardiologist:  None  Electrophysiologist:  None   Referring MD: Lenell Antu, DO   Chief Complaint  Patient presents with   Palpitations     History of Present Illness:    Stacey Pena is a 40 y.o. female with a hx of seizures, migraines who  is referred by Dr Conley Rolls for evaluation of palpitations. She was seen in the ED on 10/01/2020 with chest pain and shortness of breath and palpitations.  Labs in ED notable for potassium 2.9.  Troponin 43 > 43.  D-dimer negative.  She was discharged with cardiology follow-up.  Zio patch x7 days on 10/16/2020 showed no significant abnormalities.  She reports that she was out to dinner and felt like her heart was racing.  Also had chest pain.  Palpitations eventually resolved but she continued to have chest pain, described as pressure in the center of her chest.  Occurred off and on all night.  She has never had chest pain like this before or since.  States that she walks 2-3 times per week for 1 to 2 miles.  Denies any exertional chest pain.  Does report some dyspnea with walking up hills.  Has not had episode of palpitations like this before.  Does have issues with chronic diarrhea and vomiting.  Has been a chronic issue.  Has seen gastroenterology and had EGD done which was unremarkable.  Reports she smoked for over 10 years about half a pack per day, quit 5 years ago.  Family history includes brother had Wolff-Parkinson-White syndrome and died of unclear cause.  Father has Afib.  Past Medical History:  Diagnosis Date   Seizures (HCC)     No past surgical history on file.  Current Medications: Current Meds  Medication Sig   ibuprofen (ADVIL,MOTRIN) 200 MG tablet Take 200 mg by mouth daily as needed.   levETIRAcetam (KEPPRA) 500 MG tablet Take 2 tablets (1,000 mg total) by mouth 2 (two) times daily.   ondansetron (ZOFRAN) 4 MG tablet  Take 4 mg by mouth every 8 (eight) hours as needed for nausea or vomiting.   potassium chloride (KLOR-CON) 10 MEQ tablet Take 1 tablet (10 mEq total) by mouth daily.   venlafaxine XR (EFFEXOR XR) 37.5 MG 24 hr capsule Take 1 capsule (37.5 mg total) by mouth daily with breakfast.   zolmitriptan (ZOMIG) 5 MG tablet Take 1 tab at onset of migraine.  May repeat in 2 hrs, if needed.  Max dose: 2 tabs/day. This is a 30 day prescription.     Allergies:   Lamictal [lamotrigine], Sumatriptan, and Gadolinium derivatives   Social History   Socioeconomic History   Marital status: Married    Spouse name: Not on file   Number of children: Not on file   Years of education: Not on file   Highest education level: Not on file  Occupational History   Not on file  Tobacco Use   Smoking status: Former    Pack years: 0.00   Smokeless tobacco: Never  Substance and Sexual Activity   Alcohol use: Yes    Comment: 2-3 nights/wk   Drug use: No   Sexual activity: Not on file  Other Topics Concern   Not on file  Social History Narrative   Not on file   Social Determinants of Health   Financial Resource Strain: Not  on file  Food Insecurity: Not on file  Transportation Needs: Not on file  Physical Activity: Not on file  Stress: Not on file  Social Connections: Not on file     Family History: Family history includes brother had Wolff-Parkinson-White syndrome and died of unclear cause.  Father has Afib.  ROS:   Please see the history of present illness.     All other systems reviewed and are negative.  EKGs/Labs/Other Studies Reviewed:    The following studies were reviewed today:   EKG:  EKG is ordered today.  The ekg ordered today demonstrates sinus rhythm, rate 96, right axis deviation, incomplete right bundle branch block  Recent Labs: 10/01/2020: BUN 9; Creatinine, Ser 0.49; Hemoglobin 12.3; Magnesium 1.8; Platelets 195; Potassium 2.9; Sodium 135  Recent Lipid Panel No results found for:  CHOL, TRIG, HDL, CHOLHDL, VLDL, LDLCALC, LDLDIRECT  Physical Exam:    VS:  BP 108/80 (BP Location: Left Arm, Patient Position: Sitting)   Pulse 96   Ht 5\' 5"  (1.651 m)   Wt 170 lb 12.8 oz (77.5 kg)   SpO2 96%   BMI 28.42 kg/m     Wt Readings from Last 3 Encounters:  10/25/20 170 lb 12.8 oz (77.5 kg)  10/01/20 175 lb (79.4 kg)  09/26/20 175 lb (79.4 kg)     GEN:  Well nourished, well developed in no acute distress HEENT: Normal NECK: No JVD; No carotid bruits LYMPHATICS: No lymphadenopathy CARDIAC: RRR, no murmurs, rubs, gallops RESPIRATORY:  Clear to auscultation without rales, wheezing or rhonchi  ABDOMEN: Soft, non-tender, non-distended MUSCULOSKELETAL:  No edema; No deformity  SKIN: Warm and dry NEUROLOGIC:  Alert and oriented x 3 PSYCHIATRIC:  Normal affect   ASSESSMENT:    1. Palpitations   2. Abnormal EKG   3. Chest pain of uncertain etiology   4. Lipid screening   5. Hypokalemia    PLAN:    Palpitations: Description concerning for arrhythmia.  Zio patch x7 days was unremarkable.  Given infrequent episodes, recommended Kardia mobile device for further monitoring.  Could have been driven by hypokalemia noted during recent ED visit.  Will check CMP, magnesium, TSH  Chest pain/shortness of breath: Reported chest pain and dyspnea during episode of palpitations and had mild troponin elevation (43).  Suspect demand ischemia in setting of likely tachyarrhythmia.  She denies exertional chest pain but does report she gets short of breath with exertion.  EKG is interpretable and she is able to exercise.  Recommended ETT.  Will check echocardiogram to rule out structural heart disease.  Hypokalemia: Reports issues with chronic diarrhea and vomiting, has been seen by GI.  Suspect this was the cause of her hypokalemia during ED visit, which may have led to tachyarrhythmia as above.  Started on potassium supplement.  Will check CMP, magnesium  Lipid screening: We will check  lipid panel  RTC in 3 months  Shared Decision Making/Informed Consent The risks [chest pain, shortness of breath, cardiac arrhythmias, dizziness, blood pressure fluctuations, myocardial infarction, stroke/transient ischemic attack, and life-threatening complications (estimated to be 1 in 10,000)], benefits (risk stratification, diagnosing coronary artery disease, treatment guidance) and alternatives of an exercise tolerance test were discussed in detail with Ms. 11/26/20 and she agrees to proceed.   Medication Adjustments/Labs and Tests Ordered: Current medicines are reviewed at length with the patient today.  Concerns regarding medicines are outlined above.  Orders Placed This Encounter  Procedures   Comprehensive metabolic panel   Lipid panel   Magnesium  TSH   Cardiac Stress Test: Informed Consent Details: Physician/Practitioner Attestation; Transcribe to consent form and obtain patient signature   EXERCISE TOLERANCE TEST (ETT)   EKG 12-Lead   ECHOCARDIOGRAM COMPLETE   No orders of the defined types were placed in this encounter.   Patient Instructions  Medication Instructions:  Your physician recommends that you continue on your current medications as directed. Please refer to the Current Medication list given to you today.  *If you need a refill on your cardiac medications before your next appointment, please call your pharmacy*   Lab Work: CMET, Mag, Lipid, TSH today  If you have labs (blood work) drawn today and your tests are completely normal, you will receive your results only by: MyChart Message (if you have MyChart) OR A paper copy in the mail If you have any lab test that is abnormal or we need to change your treatment, we will call you to review the results.   Testing/Procedures: Your physician has requested that you have an exercise tolerance test. For further information please visit [email or web address] Please also follow instruction sheet, as given.  Your  physician has requested that you have an echocardiogram. Echocardiography is a painless test that uses sound waves to create images of your heart. It provides your doctor with information about the size and shape of your heart and how well your heart's chambers and valves are working. This procedure takes approximately one hour. There are no restrictions for this procedure.  This will be done at our [facility] location:  [address]  Follow-Up: At [facility], you and your health needs are our priority.  As part of our continuing mission to provide you with exceptional heart care, we have created designated Provider Care Teams.  These Care Teams include your primary Cardiologist (physician) and Advanced Practice Providers (APPs -  Physician Assistants and Nurse Practitioners) who all work together to provide you with the care you need, when you need it.  We recommend signing up for the patient portal called "MyChart".  Sign up information is provided on this After Visit Summary.  MyChart is used to connect with patients for Virtual Visits (Telemedicine).  Patients are able to view lab/test results, encounter notes, upcoming appointments, etc.  Non-urgent messages can be sent to your provider as well.   To learn more about what you can do with MyChart, go to [email or web address].    Your next appointment:   3 month(s)  The format for your next appointment:   In Person  Provider:   Epifanio Lesches, MD   Other Instructions Recommend: [facility]   Signed, Little Ishikawa, MD  10/25/2020 12:19 PM    [facility]

## 2020-10-28 ENCOUNTER — Other Ambulatory Visit (HOSPITAL_BASED_OUTPATIENT_CLINIC_OR_DEPARTMENT_OTHER): Payer: Self-pay

## 2020-10-28 ENCOUNTER — Other Ambulatory Visit: Payer: Self-pay | Admitting: *Deleted

## 2020-10-28 ENCOUNTER — Encounter: Payer: Self-pay | Admitting: *Deleted

## 2020-10-28 DIAGNOSIS — E876 Hypokalemia: Secondary | ICD-10-CM

## 2020-10-28 MED ORDER — POTASSIUM CHLORIDE ER 20 MEQ PO TBCR: 20.0000 meq | EXTENDED_RELEASE_TABLET | Freq: Every day | ORAL | 0 refills | Status: DC

## 2020-10-29 ENCOUNTER — Other Ambulatory Visit: Payer: Self-pay | Admitting: *Deleted

## 2020-10-29 DIAGNOSIS — E781 Pure hyperglyceridemia: Secondary | ICD-10-CM

## 2020-10-29 DIAGNOSIS — R1110 Vomiting, unspecified: Secondary | ICD-10-CM

## 2020-10-29 DIAGNOSIS — R197 Diarrhea, unspecified: Secondary | ICD-10-CM

## 2020-10-29 DIAGNOSIS — E876 Hypokalemia: Secondary | ICD-10-CM

## 2020-10-30 MED ORDER — POTASSIUM CHLORIDE ER 20 MEQ PO TBCR: 20.0000 meq | EXTENDED_RELEASE_TABLET | Freq: Every day | ORAL | 0 refills | Status: DC

## 2020-10-30 NOTE — Telephone Encounter (Signed)
We received fax from [employer]. They spoke to the patient and she expressed concern about the length of time needed for the prolonged EEG (120 hrs). They attempted to contact her after this conversation and never received a response back.  I spoke to the patient today. She was not able to take the time off work to complete the 120hr EEG. I told her we could speak to Dr. Terrace Arabia about reducing the amount of time being monitored (maybe 48-72 hrs, if appropriate). The patient still declined. States she is seeing a cardiologist that has also ordered several tests. She would like to finish that work up first and will call back if she wants to move forward with the ambulatory EEG.

## 2020-11-04 ENCOUNTER — Other Ambulatory Visit: Payer: Self-pay | Admitting: *Deleted

## 2020-11-04 DIAGNOSIS — E876 Hypokalemia: Secondary | ICD-10-CM | POA: Diagnosis not present

## 2020-11-04 DIAGNOSIS — R1110 Vomiting, unspecified: Secondary | ICD-10-CM | POA: Diagnosis not present

## 2020-11-04 DIAGNOSIS — E781 Pure hyperglyceridemia: Secondary | ICD-10-CM

## 2020-11-04 DIAGNOSIS — R197 Diarrhea, unspecified: Secondary | ICD-10-CM | POA: Diagnosis not present

## 2020-11-04 LAB — BASIC METABOLIC PANEL
BUN/Creatinine Ratio: 26 — ABNORMAL HIGH (ref 9–23)
BUN: 15 mg/dL (ref 6–20)
CO2: 23 mmol/L (ref 20–29)
Calcium: 9.1 mg/dL (ref 8.7–10.2)
Chloride: 101 mmol/L (ref 96–106)
Creatinine, Ser: 0.57 mg/dL (ref 0.57–1.00)
Glucose: 91 mg/dL (ref 65–99)
Potassium: 4 mmol/L (ref 3.5–5.2)
Sodium: 141 mmol/L (ref 134–144)
eGFR: 118 mL/min/{1.73_m2} (ref >59)

## 2020-11-04 LAB — LIPID PANEL
Chol/HDL Ratio: 4 ratio (ref 0.0–4.4)
Cholesterol, Total: 321 mg/dL — ABNORMAL HIGH (ref 100–199)
HDL: 81 mg/dL (ref >39)
LDL Chol Calc (NIH): 137 mg/dL — ABNORMAL HIGH (ref 0–99)
Triglycerides: 559 mg/dL (ref 0–149)
VLDL Cholesterol Cal: 103 mg/dL — ABNORMAL HIGH (ref 5–40)

## 2020-11-04 LAB — LIPASE: Lipase: 43 U/L (ref 14–72)

## 2020-11-06 ENCOUNTER — Telehealth: Payer: Self-pay | Admitting: Cardiology

## 2020-11-06 MED ORDER — FENOFIBRATE 48 MG PO TABS: 48.0000 mg | ORAL_TABLET | Freq: Every day | ORAL | 3 refills | Status: DC

## 2020-11-06 NOTE — Telephone Encounter (Signed)
Spoke with pt, questions regarding recent lipid profile answered.

## 2020-11-06 NOTE — Telephone Encounter (Signed)
Patient was calling with question about the mychart results. Please advise

## 2020-11-07 ENCOUNTER — Telehealth (HOSPITAL_COMMUNITY): Payer: Self-pay | Admitting: *Deleted

## 2020-11-07 NOTE — Telephone Encounter (Signed)
Close encounter 

## 2020-11-08 ENCOUNTER — Ambulatory Visit (HOSPITAL_COMMUNITY)
Admission: RE | Admit: 2020-11-08 | Discharge: 2020-11-08 | Disposition: A | Discharge status: Home / Self Care | Payer: BC Managed Care – PPO | Source: Ambulatory Visit | Attending: Cardiology | Admitting: Cardiology

## 2020-11-08 ENCOUNTER — Other Ambulatory Visit: Payer: Self-pay

## 2020-11-08 DIAGNOSIS — R079 Chest pain, unspecified: Secondary | ICD-10-CM

## 2020-11-08 LAB — EXERCISE TOLERANCE TEST
Estimated workload: 10.2 METS
Exercise duration (min): 9 min
Exercise duration (sec): 8 s
MPHR: 181 {beats}/min
Peak HR: 171 {beats}/min
Percent HR: 94 %
Rest HR: 92 {beats}/min

## 2020-11-08 NOTE — Addendum Note (Signed)
Addended by: Johney Frame A on: 11/08/2020 03:06 PM   Modules accepted: Orders

## 2020-11-19 ENCOUNTER — Other Ambulatory Visit: Payer: Self-pay

## 2020-11-19 ENCOUNTER — Ambulatory Visit (INDEPENDENT_AMBULATORY_CARE_PROVIDER_SITE_OTHER)
Admission: RE | Admit: 2020-11-19 | Discharge: 2020-11-19 | Disposition: A | Discharge status: Home / Self Care | Payer: Self-pay | Source: Ambulatory Visit | Attending: Cardiology | Admitting: Cardiology

## 2020-11-19 DIAGNOSIS — R002 Palpitations: Secondary | ICD-10-CM

## 2020-11-21 ENCOUNTER — Other Ambulatory Visit: Payer: Self-pay | Admitting: Cardiology

## 2020-11-22 ENCOUNTER — Telehealth (INDEPENDENT_AMBULATORY_CARE_PROVIDER_SITE_OTHER): Payer: BC Managed Care – PPO | Admitting: Internal Medicine

## 2020-11-22 ENCOUNTER — Encounter: Payer: Self-pay | Admitting: Internal Medicine

## 2020-11-22 DIAGNOSIS — R002 Palpitations: Secondary | ICD-10-CM | POA: Diagnosis not present

## 2020-11-22 DIAGNOSIS — E781 Pure hyperglyceridemia: Secondary | ICD-10-CM

## 2020-11-22 NOTE — Progress Notes (Signed)
Virtual Visit via Video Note   This visit type was conducted due to national recommendations for restrictions regarding the COVID-19 Pandemic (e.g. social distancing) in an effort to limit this patient's exposure and mitigate transmission in our community.  Due to her co-morbid illnesses, this patient is at least at moderate risk for complications without adequate follow up.  This format is felt to be most appropriate for this patient at this time.  All issues noted in this document were discussed and addressed.  A limited physical exam was performed with this format.  Please refer to the patient's chart for her consent to telehealth for [facility].      Date:  11/22/2020   ID:  Stacey Pena, DOB 15-Jul-1980, MRN [id number] The patient was identified using 2 identifiers.  Evaluation Performed:  New Patient Evaluation  Patient Location:  [address] [place] [address]  Provider location:   [address] [place], [place] [address]  PCP:  Lenell Antu, DO  Cardiologist:  None Electrophysiologist:  None   Chief Complaint:  Manage dyslipidemia  History of Present Illness:    Stacey Pena is a 40 y.o. female who presents via audio/video conferencing for a telehealth visit today.  This is a pleasant female kindly referred by Dr. Gaynelle Arabian for evaluation and management of dyslipidemia.  She has a history of palpitations and including arrhythmias in her family, most significantly her brother who I believe she has had had WPW and died of sudden cardiac death.  She also has a history of seizures on Keppra as well as migraine headaches.  She had a recent screening lipid profile which was nonfasting showing total cholesterol 269, triglycerides 686, HDL 63 and LDL of 92.  Repeat fasting lipid profile showed total cholesterol 321, triglycerides 559, HDL 81 and LDL 137.  She had not previously known that her cholesterol was elevated.  She had a recent normal TSH.  She reports a  fairly low-fat diet and is active.  The patient does not have symptoms concerning for COVID-19 infection (fever, chills, cough, or new SHORTNESS OF BREATH).    Prior CV studies:   The following studies were reviewed today:  Chart reviewed, lab work  PMHx:  Past Medical History:  Diagnosis Date   Seizures (HCC)     History reviewed. No pertinent surgical history.  FAMHx:  Brother with sudden cardiac death at young age  SOCHx:   reports that she has quit smoking. She has never used smokeless tobacco. She reports current alcohol use. She reports that she does not use drugs.  ALLERGIES:  Allergies  Allergen Reactions   Lamictal [Lamotrigine] Rash   Sumatriptan Nausea And Vomiting   Gadolinium Derivatives Rash    Pt had a rash across her chest following her injection of Multihance.  Rash assessed by Dr. Tyron Russell, pt given 50mg  grams taken immediately and released with instructions to call if anything new arose.      MEDS:  Current Meds  Medication Sig   fenofibrate (TRICOR) 48 MG tablet Take 1 tablet (48 mg total) by mouth daily.   ibuprofen (ADVIL,MOTRIN) 200 MG tablet Take 200 mg by mouth daily as needed.   levETIRAcetam (KEPPRA) 500 MG tablet Take 2 tablets (1,000 mg total) by mouth 2 (two) times daily.   Omega-3 Fatty Acids (FISH OIL) 1000 MG CAPS Take 2,000 mg by mouth daily.   ondansetron (ZOFRAN) 4 MG tablet Take 4 mg by mouth every 8 (eight) hours as  needed for nausea or vomiting.   pantoprazole (PROTONIX) 40 MG tablet Take 40 mg by mouth as needed.   Potassium Chloride ER 20 MEQ TBCR TAKE 1 TABLET BY MOUTH EVERY DAY   venlafaxine XR (EFFEXOR XR) 37.5 MG 24 hr capsule Take 1 capsule (37.5 mg total) by mouth daily with breakfast.   zolmitriptan (ZOMIG) 5 MG tablet Take 1 tab at onset of migraine.  May repeat in 2 hrs, if needed.  Max dose: 2 tabs/day. This is a 30 day prescription.     ROS: Pertinent items noted in HPI and remainder of comprehensive ROS otherwise  negative.  Labs/Other Tests and Data Reviewed:    Recent Labs: 10/01/2020: Hemoglobin 12.3; Platelets 195 10/25/2020: ALT 28; Magnesium 2.1; TSH 2.190 11/04/2020: BUN 15; Creatinine, Ser 0.57; Potassium 4.0; Sodium 141   Recent Lipid Panel Lab Results  Component Value Date/Time   CHOL 321 (H) 11/04/2020 10:42 AM   TRIG 559 (HH) 11/04/2020 10:42 AM   HDL 81 11/04/2020 10:42 AM   CHOLHDL 4.0 11/04/2020 10:42 AM   LDLCALC 137 (H) 11/04/2020 10:42 AM    Wt Readings from Last 3 Encounters:  10/25/20 170 lb 12.8 oz (77.5 kg)  10/01/20 175 lb (79.4 kg)  09/26/20 175 lb (79.4 kg)     Exam:    Vital Signs:  LMP 10/28/2020    General appearance: alert and no distress Lungs: No visual respiratory difficulty Abdomen: soft, non-tender; bowel sounds normal; no masses,  no organomegaly Extremities: extremities normal, atraumatic, no cyanosis or edema Skin: Skin color, texture, turgor normal. No rashes or lesions Neurologic: Grossly normal Psych: Pleasant  ASSESSMENT & PLAN:    Mixed dyslipidemia with high triglycerides  Ms. Osentoski has a mixed dyslipidemia with high triglycerides.  This is likely genetic but also of note she has an elevated HDL cholesterol and mildly elevated LDL cholesterol.  This could suggest familial combined hyperlipidemia or perhaps dysbetalipoproteinemia.  I believe genetic testing will be very helpful to further understand this and she is interested in proceeding.  She did actually have a recent calcium score which was 0, that is reassuring however it may be too premature for her to have calcified her coronaries.  As for therapy, she is already on fenofibrate 48 mg daily.  Advise adding over-the-counter fish oil.  She would not likely qualify for Vascepa.  As her LDL cholesterol is elevated, she may also benefit from a statin, but I would like to see her genetic testing first.  Thanks for this interesting referral.  COVID-19 Education: The signs and symptoms of  COVID-19 were discussed with the patient and how to seek care for testing (follow up with PCP or arrange E-visit).  The importance of social distancing was discussed today.  Patient Risk:   After full review of this patients clinical status, I feel that they are at least moderate risk at this time.  Time:   Today, I have spent 25 minutes with the patient with telehealth technology discussing dyslipidemia, high triglycerides, calcium score results.     Medication Adjustments/Labs and Tests Ordered: Current medicines are reviewed at length with the patient today.  Concerns regarding medicines are outlined above.   Tests Ordered: Orders Placed This Encounter  Procedures   Lipid panel    Medication Changes: No orders of the defined types were placed in this encounter.   Disposition:  in 3 month(s)  Chrystie Nose, MD, [facility], FACP  [place]  [facility]  Medical Director of  the Advanced Lipid Disorders &  Cardiovascular Risk Reduction Clinic Diplomate of the American Board of Clinical Lipidology Attending Cardiologist  Direct Dial: [phone]  Fax: [phone]  Website:  [email or web address]  Chrystie Nose, MD  11/22/2020 8:07 PM

## 2020-11-22 NOTE — Patient Instructions (Signed)
Medication Instructions:  START over-the-counter fish oil - 2 grams or 2000mg  daily CONTINUE other meds  *If you need a refill on your cardiac medications before your next appointment, please call your pharmacy*   Lab Work: FASTING lab work in 3-4 months  If you have labs (blood work) drawn today and your tests are completely normal, you will receive your results only by: (if you have MyChart) OR A paper copy in the mail If you have any lab test that is abnormal or we need to change your treatment, we will call you to review the results.   Testing/Procedures: Genetic Test @ Dr. [employer] office   Follow-Up: At [facility], you and your health needs are our priority.  As part of our continuing mission to provide you with exceptional heart care, we have created designated Provider Care Teams.  These Care Teams include your primary Cardiologist (physician) and Advanced Practice Providers (APPs -  Physician Assistants and Nurse Practitioners) who all work together to provide you with the care you need, when you need it.  We recommend signing up for the patient portal called "MyChart".  Sign up information is provided on this After Visit Summary.  MyChart is used to connect with patients for Virtual Visits (Telemedicine).  Patients are able to view lab/test results, encounter notes, upcoming appointments, etc.  Non-urgent messages can be sent to your provider as well.   To learn more about what you can do with MyChart, go to [facility].    Your next appointment:   3-4 month(s) - lipid clinic  The format for your next appointment:   In Person or Virutal  Provider:   [email or web address]   Other Instructions

## 2020-11-29 ENCOUNTER — Other Ambulatory Visit: Payer: Self-pay

## 2020-11-29 ENCOUNTER — Telehealth: Payer: Self-pay | Admitting: Cardiology

## 2020-11-29 ENCOUNTER — Ambulatory Visit (HOSPITAL_COMMUNITY): Payer: BC Managed Care – PPO | Attending: Cardiovascular Disease

## 2020-11-29 DIAGNOSIS — R9431 Abnormal electrocardiogram [ECG] [EKG]: Secondary | ICD-10-CM | POA: Diagnosis not present

## 2020-11-29 LAB — ECHOCARDIOGRAM COMPLETE
Area-P 1/2: 3.3 cm2
S' Lateral: 2.9 cm

## 2020-11-29 NOTE — Telephone Encounter (Signed)
*  STAT* If patient is at the pharmacy, call can be transferred to refill team.   1. Which medications need to be refilled? (please list name of each medication and dose if known) Potassium Chloride ER 20 MEQ TBCR  2. Which pharmacy/location (including street and city if local pharmacy) is medication to be sent to? [facility] [place]  3. Do they need a 30 day or 90 day supply? 90 day   Patient has 4 tablets left

## 2020-12-25 DIAGNOSIS — Z130 Encounter for screening for diseases of the blood and blood-forming organs and certain disorders involving the immune mechanism: Secondary | ICD-10-CM | POA: Diagnosis not present

## 2020-12-25 DIAGNOSIS — Z1389 Encounter for screening for other disorder: Secondary | ICD-10-CM | POA: Diagnosis not present

## 2020-12-25 DIAGNOSIS — Z01419 Encounter for gynecological examination (general) (routine) without abnormal findings: Secondary | ICD-10-CM | POA: Diagnosis not present

## 2020-12-25 DIAGNOSIS — Z6829 Body mass index (BMI) 29.0-29.9, adult: Secondary | ICD-10-CM | POA: Diagnosis not present

## 2021-02-27 NOTE — Progress Notes (Signed)
Cardiology Office Note:    Date:  02/28/2021   ID:  Stacey Pena, DOB Sep 23, 1980, MRN [id number]  PCP:  Lenell Antu, DO  Cardiologist:  None  Electrophysiologist:  None   Referring MD: Lenell Antu, DO   Chief Complaint  Patient presents with   Follow-up    3 months.      History of Present Illness:    Stacey Pena is a 40 y.o. female with a hx of seizures, migraines who presents for follow-up.  She was referred by Dr Conley Rolls for evaluation of palpitations, initially seen on 10/25/2020.  She was seen in the ED on 10/01/2020 with chest pain and shortness of breath and palpitations.  Labs in ED notable for potassium 2.9.  Troponin 43 > 43.  D-dimer negative.  She was discharged with cardiology follow-up.  Zio patch x7 days on 10/16/2020 showed no significant abnormalities.  She reports that she was out to dinner and felt like her heart was racing.  Also had chest pain.  Palpitations eventually resolved but she continued to have chest pain, described as pressure in the center of her chest.  Occurred off and on all night.  She has never had chest pain like this before or since.  States that she walks 2-3 times per week for 1 to 2 miles.  Denies any exertional chest pain.  Does report some dyspnea with walking up hills.  Has not had episode of palpitations like this before.  Does have issues with chronic diarrhea and vomiting.  Has been a chronic issue.  Has seen gastroenterology and had EGD done which was unremarkable.  Reports she smoked for over 10 years about half a pack per day, quit 5 years ago.  Family history includes brother had Wolff-Parkinson-White syndrome and died of unclear cause.  Father has Afib.  ETT on 11/08/2020 showed normal stress test.  Echocardiogram 11/29/2020 showed normal biventricular function, no significant valvular disease.  Calcium score on 11/19/2020 was 0.  Triglycerides significantly elevated (686), was referred to Dr. Rennis Golden in lipid clinic.  Since last clinic visit, she  reports that she is doing well.  States that she stopped taking Effexor and her symptoms improved.  Chest pain and palpitations have resolved.  Denies any chest pain, dyspnea, lightheadedness, syncope, lower extremity edema, or palpitations.  She has been going to the gym at least 3 times per week, does treadmill or elliptical for 2 miles.  Denies any exertional symptoms.   Past Medical History:  Diagnosis Date   Seizures (HCC)     History reviewed. No pertinent surgical history.  Current Medications: Current Meds  Medication Sig   fenofibrate (TRICOR) 48 MG tablet Take 1 tablet (48 mg total) by mouth daily.   ibuprofen (ADVIL,MOTRIN) 200 MG tablet Take 200 mg by mouth daily as needed.   levETIRAcetam (KEPPRA) 500 MG tablet Take 2 tablets (1,000 mg total) by mouth 2 (two) times daily.   Omega-3 Fatty Acids (FISH OIL) 1000 MG CAPS Take 2,000 mg by mouth daily.   ondansetron (ZOFRAN) 4 MG tablet Take 4 mg by mouth every 8 (eight) hours as needed for nausea or vomiting.   pantoprazole (PROTONIX) 40 MG tablet Take 40 mg by mouth as needed.   Potassium Chloride ER 20 MEQ TBCR TAKE 1 TABLET BY MOUTH EVERY DAY   zolmitriptan (ZOMIG) 5 MG tablet Take 1 tab at onset of migraine.  May repeat in 2 hrs, if needed.  Max dose: 2 tabs/day. This is a  30 day prescription.     Allergies:   Lamictal [lamotrigine], Sumatriptan, and Gadolinium derivatives   Social History   Socioeconomic History   Marital status: Married    Spouse name: Not on file   Number of children: Not on file   Years of education: Not on file   Highest education level: Not on file  Occupational History   Not on file  Tobacco Use   Smoking status: Former   Smokeless tobacco: Never  Substance and Sexual Activity   Alcohol use: Yes    Comment: 2-3 nights/wk   Drug use: No   Sexual activity: Not on file  Other Topics Concern   Not on file  Social History Narrative   ** Merged History Encounter **       Social  Determinants of Health   Financial Resource Strain: Not on file  Food Insecurity: Not on file  Transportation Needs: Not on file  Physical Activity: Not on file  Stress: Not on file  Social Connections: Not on file     Family History: Family history includes brother had Wolff-Parkinson-White syndrome and died of unclear cause.  Father has Afib.  ROS:   Please see the history of present illness.     All other systems reviewed and are negative.  EKGs/Labs/Other Studies Reviewed:    The following studies were reviewed today:   EKG:  EKG is ordered today.  The ekg ordered today demonstrates sinus rhythm, rate 96, right axis deviation, incomplete right bundle branch block  Recent Labs: 10/01/2020: Hemoglobin 12.3; Platelets 195 10/25/2020: ALT 28; Magnesium 2.1; TSH 2.190 11/04/2020: BUN 15; Creatinine, Ser 0.57; Potassium 4.0; Sodium 141  Recent Lipid Panel    Component Value Date/Time   CHOL 321 (H) 11/04/2020 1042   TRIG 559 (HH) 11/04/2020 1042   HDL 81 11/04/2020 1042   CHOLHDL 4.0 11/04/2020 1042   LDLCALC 137 (H) 11/04/2020 1042    Physical Exam:    VS:  BP 114/80 (BP Location: Left Arm, Patient Position: Sitting, Cuff Size: Normal)   Pulse 80   Ht 5\' 5"  (1.651 m)   Wt 176 lb (79.8 kg)   BMI 29.29 kg/m     Wt Readings from Last 3 Encounters:  02/28/21 176 lb (79.8 kg)  10/25/20 170 lb 12.8 oz (77.5 kg)  10/01/20 175 lb (79.4 kg)     GEN:  Well nourished, well developed in no acute distress HEENT: Normal NECK: No JVD; No carotid bruits LYMPHATICS: No lymphadenopathy CARDIAC: RRR, no murmurs, rubs, gallops RESPIRATORY:  Clear to auscultation without rales, wheezing or rhonchi  ABDOMEN: Soft, non-tender, non-distended MUSCULOSKELETAL:  No edema; No deformity  SKIN: Warm and dry NEUROLOGIC:  Alert and oriented x 3 PSYCHIATRIC:  Normal affect   ASSESSMENT:    1. Palpitations   2. Chest pain of uncertain etiology   3. Hypokalemia   4. High triglycerides    5. Vomiting and diarrhea     PLAN:    Palpitations: Description concerning for arrhythmia.  Zio patch x7 days was unremarkable.  Given infrequent episodes, recommended Kardia mobile device for further monitoring.  Could have been driven by hypokalemia noted during recent ED visit, which had resolved on recent labs.   -She reports palpitations have resolved  Chest pain/shortness of breath: Reported chest pain and dyspnea during episode of palpitations and had mild troponin elevation (43).  Suspect demand ischemia in setting of likely tachyarrhythmia.  She denies exertional chest pain but does report she gets  short of breath with exertion.  EKG is interpretable and she is able to exercise.  ETT on 11/08/2020 showed normal stress test.  Echocardiogram 11/29/2020 showed normal biventricular function, no significant valvular disease.  Calcium score on 11/19/2020 was 0.  Reports symptoms have resolved.  Hypokalemia: Reports issues with chronic diarrhea and vomiting, has been seen by GI.  Suspect this was the cause of her hypokalemia during ED visit, which may have led to tachyarrhythmia as above.  Started on potassium supplement.  -Will check BMET, magnesium -Refer to GI given chronic N/V/D.  Would consider evaluation for chronic pancreatitis given her hypertriglyceridemia  Hypertriglyceridemia: Follows with Dr. Rennis Golden in lipid clinic, checking genetic testing  RTC in 1 year  Medication Adjustments/Labs and Tests Ordered: Current medicines are reviewed at length with the patient today.  Concerns regarding medicines are outlined above.  No orders of the defined types were placed in this encounter.  No orders of the defined types were placed in this encounter.   There are no Patient Instructions on file for this visit.   Signed, Little Ishikawa, MD  02/28/2021 9:06 AM    [facility]

## 2021-02-28 ENCOUNTER — Encounter: Payer: Self-pay | Admitting: Cardiology

## 2021-02-28 ENCOUNTER — Other Ambulatory Visit: Payer: Self-pay

## 2021-02-28 ENCOUNTER — Ambulatory Visit (INDEPENDENT_AMBULATORY_CARE_PROVIDER_SITE_OTHER): Payer: BC Managed Care – PPO | Admitting: Cardiology

## 2021-02-28 VITALS — BP 114/80 | HR 80 | Ht 65.0 in | Wt 176.0 lb

## 2021-02-28 DIAGNOSIS — E781 Pure hyperglyceridemia: Secondary | ICD-10-CM | POA: Diagnosis not present

## 2021-02-28 DIAGNOSIS — R1110 Vomiting, unspecified: Secondary | ICD-10-CM

## 2021-02-28 DIAGNOSIS — R079 Chest pain, unspecified: Secondary | ICD-10-CM | POA: Diagnosis not present

## 2021-02-28 DIAGNOSIS — Z79899 Other long term (current) drug therapy: Secondary | ICD-10-CM | POA: Diagnosis not present

## 2021-02-28 DIAGNOSIS — R002 Palpitations: Secondary | ICD-10-CM

## 2021-02-28 DIAGNOSIS — E876 Hypokalemia: Secondary | ICD-10-CM | POA: Diagnosis not present

## 2021-02-28 DIAGNOSIS — R197 Diarrhea, unspecified: Secondary | ICD-10-CM

## 2021-02-28 LAB — MAGNESIUM: Magnesium: 1.9 mg/dL (ref 1.6–2.3)

## 2021-02-28 LAB — BASIC METABOLIC PANEL
BUN/Creatinine Ratio: 24 — ABNORMAL HIGH (ref 9–23)
BUN: 13 mg/dL (ref 6–20)
CO2: 25 mmol/L (ref 20–29)
Calcium: 9.7 mg/dL (ref 8.7–10.2)
Chloride: 98 mmol/L (ref 96–106)
Creatinine, Ser: 0.55 mg/dL — ABNORMAL LOW (ref 0.57–1.00)
Glucose: 111 mg/dL — ABNORMAL HIGH (ref 70–99)
Potassium: 4.8 mmol/L (ref 3.5–5.2)
Sodium: 137 mmol/L (ref 134–144)
eGFR: 119 mL/min/{1.73_m2} (ref >59)

## 2021-02-28 LAB — LIPASE: Lipase: 45 U/L (ref 14–72)

## 2021-02-28 NOTE — Patient Instructions (Signed)
Medication Instructions:  Your Physician recommend you continue on your current medication as directed.    *If you need a refill on your cardiac medications before your next appointment, please call your pharmacy*   Lab Work: Your physician recommends lab work today ( BMP, Mg, Lipase)  If you have labs (blood work) drawn today and your tests are completely normal, you will receive your results only by: MyChart Message (if you have MyChart) OR A paper copy in the mail If you have any lab test that is abnormal or we need to change your treatment, we will call you to review the results.   Testing/Procedures: None ordered today   Follow-Up: At [facility], you and your health needs are our priority.  As part of our continuing mission to provide you with exceptional heart care, we have created designated Provider Care Teams.  These Care Teams include your primary Cardiologist (physician) and Advanced Practice Providers (APPs -  Physician Assistants and Nurse Practitioners) who all work together to provide you with the care you need, when you need it.  We recommend signing up for the patient portal called "MyChart".  Sign up information is provided on this After Visit Summary.  MyChart is used to connect with patients for Virtual Visits (Telemedicine).  Patients are able to view lab/test results, encounter notes, upcoming appointments, etc.  Non-urgent messages can be sent to your provider as well.   To learn more about what you can do with MyChart, go to [email or web address].    Your next appointment:   1 year(s)  The format for your next appointment:   In Person  Provider:   Epifanio Lesches, MD

## 2021-03-07 ENCOUNTER — Ambulatory Visit (HOSPITAL_BASED_OUTPATIENT_CLINIC_OR_DEPARTMENT_OTHER)
Admission: RE | Admit: 2021-03-07 | Payer: BC Managed Care – PPO | Source: Home / Self Care | Admitting: Obstetrics and Gynecology

## 2021-03-07 ENCOUNTER — Encounter (HOSPITAL_BASED_OUTPATIENT_CLINIC_OR_DEPARTMENT_OTHER): Admission: RE | Payer: Self-pay | Source: Home / Self Care

## 2021-03-07 SURGERY — SALPINGECTOMY, BILATERAL, LAPAROSCOPIC
Anesthesia: General | Laterality: Bilateral

## 2021-03-15 ENCOUNTER — Other Ambulatory Visit: Payer: Self-pay | Admitting: Neurology

## 2021-03-19 NOTE — Telephone Encounter (Signed)
Left message to call back in regards to previously sent MyChart message

## 2021-03-21 ENCOUNTER — Other Ambulatory Visit: Payer: Self-pay | Admitting: *Deleted

## 2021-03-21 MED ORDER — POTASSIUM CHLORIDE ER 20 MEQ PO TBCR: 1.0000 | EXTENDED_RELEASE_TABLET | Freq: Every day | ORAL | 2 refills | Status: DC | PRN

## 2021-04-07 ENCOUNTER — Telehealth: Payer: Self-pay | Admitting: Neurology

## 2021-04-07 NOTE — Telephone Encounter (Signed)
Sent message for patient to call us to reschedule appointment because Maralyn Sago will not be back from leave yet.

## 2021-04-16 ENCOUNTER — Ambulatory Visit: Payer: BC Managed Care – PPO | Admitting: Neurology

## 2021-08-20 ENCOUNTER — Telehealth (INDEPENDENT_AMBULATORY_CARE_PROVIDER_SITE_OTHER): Payer: Self-pay | Admitting: Neurology

## 2021-08-20 ENCOUNTER — Encounter: Payer: Self-pay | Admitting: Neurology

## 2021-08-20 DIAGNOSIS — R569 Unspecified convulsions: Secondary | ICD-10-CM

## 2021-08-20 DIAGNOSIS — G43709 Chronic migraine without aura, not intractable, without status migrainosus: Secondary | ICD-10-CM

## 2021-08-20 MED ORDER — LEVETIRACETAM 500 MG PO TABS: 1000.0000 mg | ORAL_TABLET | Freq: Two times a day (BID) | ORAL | 4 refills | Status: DC

## 2021-08-20 NOTE — Progress Notes (Signed)
? ?Virtual Visit via Video Note ? ?I connected with Stacey Pena on 08/20/21 at  2:15 PM EDT by a video enabled telemedicine application and verified that I am speaking with the correct person using two identifiers. ? ?Location: ?Patient: at work  ?Provider: in the office  ?  ?I discussed the limitations of evaluation and management by telemedicine and the availability of in person appointments. The patient expressed understanding and agreed to proceed. ? ?History of Present Illness: ? Stacey Pena is a 41 year old female, accompanied by her husband, seen in refer by primary care doctor Conley Rolls, [place], for evaluation of seizure,Initial evaluation was on May 31, 2017. ?  ?She was previously healthy, deny a personal family history of seizure ?  ?On May 22, 2017, while shopping with her daughter in the mall, without warning signs, she suddenly raised her right arm, sliding to the floor, then had a witnessed generalized tonic-clonic seizure, no incontinence, with right lateral tongue biting.  Seizure last about 2 minutes, [facility], ?  ?laboratory evaluations normal CBC, hemoglobin 12.9, CMP, creatinine 0.6, CT head without contrast showed no acute abnormality ?  ?She recently started a new job ?  ?She had frequent abdominal pain, GI symptoms since summer of 2018, MRI of abdomen in May 2018 showed multiple enhancing hepatic lesions in both lobes, measuring up to 2.8 cm, favoring the diagnosis of FNH, ?  ?Update July 12, 2017: ?  ?MRI of the brain with and without contrast was normal on June 13, 2017. ?  ?EEG in January 2019 showed evidence of mild background slowing, ?  ?She has tried lamotrigine, cause rash, she is now taking Keppra 500 mg twice a day, tolerating it was normal well. ?  ?UPDATE December 30 2017: ?She complains of frequent right-sided headaches since July 2019, about 3-4 times each week, right retro-orbital, moderate pain, she tends to close her eyes which helps  her headaches sound, oftentimes is getting worse as day goes on, ?  ?She has no recurrent seizure ?  ?UPDATE Jul 13 2018: ?She overall is doing very well, there was no recurrent seizure tolerating Keppra 500 mg twice a day, has intermittent migraine headaches every 2 to 3 weeks, complains of nausea with Imitrex, taking ibuprofen as needed works well for her most of the time ?  ?UPDATE Jul 03 2019: ?She is doing very well, no recurrent seizure, taking keppra 500/1000mg ,her frequent migraine has improved, but still has intermittent sensitivity on the right side, complains of nausea with Imitrex, ?  ?UPDATE Sep 26 2020: ?  ?She complains of excessive stress recently, increased migraine to couple times each month, suboptimal response to Maxalt, which often make her feel tired, previously did not respond well to Imitrex, will try Zomig at this time ?  ?Her father developed nocturnal seizure in his 25s, she has a brother died from sleep for unknown reasons, he did have a spell with tongue biting motor vehicle accident prior to sudden pass away. ?  ?Patient reported in the past few months, couple times each week, she woke up from sleep felt body jerking, tight, teeth clenching, those episode make her worried about nocturnal seizure, desire further evaluation, she is now taking Keppra 500/1000 mg ? ?Update August 20, 2021 SS: Still under a lot of stress, 47 30 year old kids getting ready to go to college. We increased Keppra 1000 mg twice daily last visit. At night reports 3-4 nights vomiting/diarrhea, related to Keppra? Eat  at 7 PM, go to sleep, wake up at 2-4 AM with v/d. Takes Keppra at 6 AM, 5 PM. Symptoms going on for years, has seen GI. Heart issue, on fenofibrate. Was on Effexor, felt foggy headed, stopped it. Migraines are once a month, takes Advil first, then will take Zomig, it works. Video monitoring EEG, they called her told her for a week. Still having few spells waking jump out of sleep, gasp, right sided teeth  clench, seems more anxiety.  ?  ?Observations/Objective: ?Via virtual visit, is alert and oriented, speech is clear and concise, facial symmetry noted, excellent historian, moves all extremities ? ?Assessment and Plan: ?1.  Seizures ?-Check 24 hr ambulatory EEG  ?-Continue Keppra 1000 mg twice daily  ?-Concern for nocturnal seizures (wake up in the night jaw clenched, gasp), seems anxiety related, but brother died in sleep for unknown reason, seizure? Father had nocturnal seizure ?-MRI of the brain with and without contrast in January 2019 was normal ? ?2.  Chronic migraine headaches ?-Under good control  ?-Continue Zomig as needed, suboptimal response to Imitrex, Maxalt ?-Could not tolerate Effexor ? ?Follow Up Instructions: ?6 months 02/25/2022 7:45 AM ?  ?I discussed the assessment and treatment plan with the patient. The patient was provided an opportunity to ask questions and all were answered. The patient agreed with the plan and demonstrated an understanding of the instructions. ?  ?The patient was advised to call back or seek an in-person evaluation if the symptoms worsen or if the condition fails to improve as anticipated. ? ?Margie Ege, AGNP-C, DNP  ?[facility] ?[address], [address] ?[place], [place] [address] ?([phone] ? ?

## 2021-08-20 NOTE — Patient Instructions (Signed)
Continue Keppra for seizure prevention ?Continue Zomig as needed for acute migraine treatment ?Check 24-hour ambulatory EEG ?Call for any seizure spells ?Follow-up 6 months or sooner if needed ?

## 2021-10-21 ENCOUNTER — Other Ambulatory Visit: Payer: Self-pay | Admitting: Cardiology

## 2021-10-22 ENCOUNTER — Other Ambulatory Visit: Payer: Self-pay | Admitting: Cardiology

## 2021-12-10 ENCOUNTER — Other Ambulatory Visit: Payer: Self-pay | Admitting: Neurology

## 2022-02-24 NOTE — Progress Notes (Unsigned)
Patient: Stacey Pena Date of Birth: 27-May-1980  Reason for Visit: Follow up History from: Patient Primary Neurologist: Stacey Pena  ASSESSMENT AND PLAN 41 y.o. year old female   70.  Seizures 2.  Chronic migraine headaches -Stacey Pena is doing very well today, no recent seizures or migraines -Continue Keppra 500 mg, 2 tablets twice a day -Continue Zomig 5 mg as needed for acute migraine headache -Call for any problems or concerns, seizures, otherwise follow-up in 1 year virtually  HISTORY   Stacey Pena is a 41 year old female, accompanied by her husband, seen in refer by primary care doctor Stacey Pena, for evaluation of seizure,Initial evaluation was on May 31, 2017.   She was previously healthy, deny a personal family history of seizure   On May 22, 2017, while shopping with her daughter in the mall, without warning signs, she suddenly raised her right arm, sliding to the floor, then had a witnessed generalized tonic-clonic seizure, no incontinence, with right lateral tongue biting.  Seizure last about 2 minutes, [facility],   laboratory evaluations normal CBC, hemoglobin 12.9, CMP, creatinine 0.6, CT head without contrast showed no acute abnormality   She recently started a new job   She had frequent abdominal pain, GI symptoms since summer of 2018, MRI of abdomen in May 2018 showed multiple enhancing hepatic lesions in both lobes, measuring up to 2.8 cm, favoring the diagnosis of Earle,   Update July 12, 2017:   MRI of the brain with and without contrast was normal on June 13, 2017.   EEG in January 2019 showed evidence of mild background slowing,   She has tried lamotrigine, cause rash, she is now taking Keppra 500 mg twice a day, tolerating it was normal well.   UPDATE December 30 2017: She complains of frequent right-sided headaches since July 2019, about 3-4 times each week, right retro-orbital, moderate pain, she tends to close her eyes  which helps her headaches sound, oftentimes is getting worse as day goes on,   She has no recurrent seizure   UPDATE Jul 13 2018: She overall is doing very well, there was no recurrent seizure tolerating Keppra 500 mg twice a day, has intermittent migraine headaches every 2 to 3 weeks, complains of nausea with Imitrex, taking ibuprofen as needed works well for her most of the time   UPDATE Jul 03 2019: She is doing very well, no recurrent seizure, taking keppra 500/1000mg,her frequent migraine has improved, but still has intermittent sensitivity on the right side, complains of nausea with Imitrex,   UPDATE Sep 26 2020:   She complains of excessive stress recently, increased migraine to couple times each month, suboptimal response to Maxalt, which often make her feel tired, previously did not respond well to Imitrex, will try Zomig at this time   Her father developed nocturnal seizure in his 33s, she has a brother died from sleep for unknown reasons, he did have a spell with tongue biting motor vehicle accident prior to sudden pass away.   Patient reported in the past few months, couple times each week, she woke up from sleep felt body jerking, tight, teeth clenching, those episode make her worried about nocturnal seizure, desire further evaluation, she is now taking Keppra 500/1000 mg   Update August 20, 2021 SS: Still under a lot of stress, 61 70 year old kids getting ready to go to college. We increased Keppra 1000 mg twice daily last visit. At night reports 3-4  nights vomiting/diarrhea, related to Keppra? Eat at 7 PM, go to sleep, wake up at 2-4 AM with v/d. Takes Keppra at 6 AM, 5 PM. Symptoms going on for years, has seen GI. Heart issue, on fenofibrate. Was on Effexor, felt foggy headed, stopped it. Migraines are once a month, takes Advil first, then will take Zomig, it works. Video monitoring EEG, they called her told her for a week. Still having few spells waking jump out of sleep, gasp, right  sided teeth clench, seems more anxiety.   Update February 25, 2022 SS: Doing well, remains on Keppra 1000 mg twice a day.  No reported seizures.  Tolerating medications well.  Denies any migraines, has not needed Zomig.  No further nighttime spells suspicious for seizure.  Prolonged EEG was ordered last visit was not completed.  Her 2 kids are at college.  Think stress was contributing to some of her symptoms at last visit.  No problems or concerns. Working at [employer].  REVIEW OF SYSTEMS: Out of a complete 14 system review of symptoms, the patient complains only of the following symptoms, and all other reviewed systems are negative.  See HPI  ALLERGIES: Allergies  Allergen Reactions   Lamictal [Lamotrigine] Rash   Sumatriptan Nausea And Vomiting   Gadolinium Derivatives Rash    Pt had a rash across her chest following her injection of Multihance.  Rash assessed by Dr. Thornton Papas, pt given 66m grams taken immediately and released with instructions to call if anything new arose.      HOME MEDICATIONS: Outpatient Medications Prior to Visit  Medication Sig Dispense Refill   fenofibrate (TRICOR) 48 MG tablet TAKE ONE (1) TABLET BY MOUTH EACH DAY 90 tablet 3   HAILEY 24 FE 1-20 MG-MCG(24) tablet Take 1 tablet by mouth daily.     ibuprofen (ADVIL,MOTRIN) 200 MG tablet Take 200 mg by mouth daily as needed.     Multiple Vitamin (MULTIVITAMIN PO) Take by mouth.     Omega-3 Fatty Acids (FISH OIL) 1000 MG CAPS Take 2,000 mg by mouth daily.     ondansetron (ZOFRAN) 4 MG tablet Take 4 mg by mouth every 8 (eight) hours as needed for nausea or vomiting.     pantoprazole (PROTONIX) 40 MG tablet Take 40 mg by mouth as needed.     Potassium Chloride ER 20 MEQ TBCR TAKE 1 TABLET BY MOUTH EVERY DAY 90 tablet 2   levETIRAcetam (KEPPRA) 500 MG tablet Take 2 tablets (1,000 mg total) by mouth 2 (two) times daily. 360 tablet 4   zolmitriptan (ZOMIG) 5 MG tablet TAKE 1 TABLET BY MOUTH AT ONSET OF  MIGRAINE. MAY REPEAT IN 2 HOURS IF NEEDED. MAX OF 2 TABLETS PER DAY & 12 TABLETS PER MONTH 12 tablet 5   No facility-administered medications prior to visit.    PAST MEDICAL HISTORY: Past Medical History:  Diagnosis Date   Seizures (HB and E     PAST SURGICAL HISTORY: No past surgical history on file.  FAMILY HISTORY: No family history on file.  SOCIAL HISTORY: Social History   Socioeconomic History   Marital status: Married    Spouse name: Not on file   Number of children: Not on file   Years of education: Not on file   Highest education level: Not on file  Occupational History   Not on file  Tobacco Use   Smoking status: Former   Smokeless tobacco: Never  Substance and Sexual Activity   Alcohol use: Yes  Comment: 2-3 nights/wk   Drug use: No   Sexual activity: Not on file  Other Topics Concern   Not on file  Social History Narrative   ** Merged History Encounter **       Social Determinants of Health   Financial Resource Strain: Not on file  Food Insecurity: Not on file  Transportation Needs: Not on file  Physical Activity: Not on file  Stress: Not on file  Social Connections: Not on file  Intimate Partner Violence: Not on file   PHYSICAL EXAM  Vitals:   02/25/22 0741  BP: 113/79  Pulse: 78  Weight: 172 lb 5 oz (78.2 kg)  Height: 5' 5"  (1.651 m)   Body mass index is 28.67 kg/m.  Generalized: Well developed, in no acute distress  Neurological examination  Mentation: Alert oriented to time, place, history taking. Follows all commands speech and language fluent Cranial nerve II-XII: Pupils were equal round reactive to light. Extraocular movements were full, visual field were full on confrontational test. Facial sensation and strength were normal. Head turning and shoulder shrug  were normal and symmetric. Motor: The motor testing reveals 5 over 5 strength of all 4 extremities. Good symmetric motor tone is noted throughout.  Sensory: Sensory testing is  intact to soft touch on all 4 extremities. No evidence of extinction is noted.  Coordination: Cerebellar testing reveals good finger-nose-finger and heel-to-shin bilaterally.  Gait and station: Gait is normal.  Reflexes: Deep tendon reflexes are symmetric and normal bilaterally.   DIAGNOSTIC DATA (LABS, IMAGING, TESTING) - I reviewed patient records, labs, notes, testing and imaging myself where available.  Lab Results  Component Value Date   WBC 4.2 10/01/2020   HGB 12.3 10/01/2020   HCT 35.1 (L) 10/01/2020   MCV 103.8 (H) 10/01/2020   PLT 195 10/01/2020      Component Value Date/Time   NA 137 02/28/2021 0912   K 4.8 02/28/2021 0912   CL 98 02/28/2021 0912   CO2 25 02/28/2021 0912   GLUCOSE 111 (H) 02/28/2021 0912   GLUCOSE 114 (H) 10/01/2020 1245   BUN 13 02/28/2021 0912   CREATININE 0.55 (L) 02/28/2021 0912   CALCIUM 9.7 02/28/2021 0912   PROT 7.1 10/25/2020 0932   ALBUMIN 4.6 10/25/2020 0932   AST 25 10/25/2020 0932   ALT 28 10/25/2020 0932   ALKPHOS 48 10/25/2020 0932   BILITOT 0.7 10/25/2020 0932   GFRNONAA >60 10/01/2020 1245   GFRAA  07/09/2008 2030    >60        The eGFR has been calculated using the MDRD equation. This calculation has not been validated in all clinical situations. eGFR's persistently <60 mL/min signify possible Chronic Kidney Disease.   Lab Results  Component Value Date   CHOL 321 (H) 11/04/2020   HDL 81 11/04/2020   LDLCALC 137 (H) 11/04/2020   TRIG 559 (HH) 11/04/2020   CHOLHDL 4.0 11/04/2020   No results found for: "HGBA1C" No results found for: "VITAMINB12" Lab Results  Component Value Date   TSH 2.190 10/25/2020    Butler Denmark, AGNP-C, DNP 02/25/2022, 8:03 AM [facility] [address], [place], [place] [address] [phone]

## 2022-02-25 ENCOUNTER — Ambulatory Visit: Payer: No Typology Code available for payment source | Admitting: Neurology

## 2022-02-25 ENCOUNTER — Encounter: Payer: Self-pay | Admitting: Neurology

## 2022-02-25 VITALS — BP 113/79 | HR 78 | Ht 65.0 in | Wt 172.3 lb

## 2022-02-25 DIAGNOSIS — G43709 Chronic migraine without aura, not intractable, without status migrainosus: Secondary | ICD-10-CM

## 2022-02-25 DIAGNOSIS — R569 Unspecified convulsions: Secondary | ICD-10-CM

## 2022-02-25 MED ORDER — ZOLMITRIPTAN 5 MG PO TABS: ORAL_TABLET | ORAL | 5 refills | Status: AC

## 2022-02-25 MED ORDER — LEVETIRACETAM 500 MG PO TABS: 1000.0000 mg | ORAL_TABLET | Freq: Two times a day (BID) | ORAL | 4 refills | Status: DC

## 2022-06-15 ENCOUNTER — Other Ambulatory Visit: Payer: Self-pay

## 2022-06-24 ENCOUNTER — Other Ambulatory Visit: Payer: Self-pay

## 2022-06-24 ENCOUNTER — Emergency Department (HOSPITAL_BASED_OUTPATIENT_CLINIC_OR_DEPARTMENT_OTHER)
Admission: EM | Admit: 2022-06-24 | Discharge: 2022-06-24 | Disposition: A | Discharge status: Home / Self Care | Payer: No Typology Code available for payment source | Attending: Emergency Medicine | Admitting: Emergency Medicine

## 2022-06-24 ENCOUNTER — Emergency Department (HOSPITAL_BASED_OUTPATIENT_CLINIC_OR_DEPARTMENT_OTHER): Payer: No Typology Code available for payment source

## 2022-06-24 DIAGNOSIS — R4781 Slurred speech: Secondary | ICD-10-CM

## 2022-06-24 DIAGNOSIS — E876 Hypokalemia: Secondary | ICD-10-CM | POA: Diagnosis not present

## 2022-06-24 DIAGNOSIS — D72819 Decreased white blood cell count, unspecified: Secondary | ICD-10-CM | POA: Insufficient documentation

## 2022-06-24 DIAGNOSIS — R7989 Other specified abnormal findings of blood chemistry: Secondary | ICD-10-CM

## 2022-06-24 DIAGNOSIS — R202 Paresthesia of skin: Secondary | ICD-10-CM | POA: Diagnosis not present

## 2022-06-24 LAB — URINALYSIS, ROUTINE W REFLEX MICROSCOPIC
Bilirubin Urine: NEGATIVE
Glucose, UA: NEGATIVE mg/dL
Ketones, ur: NEGATIVE mg/dL
Nitrite: NEGATIVE
Protein, ur: NEGATIVE mg/dL
Specific Gravity, Urine: 1.01 (ref 1.005–1.030)
pH: 7.5 (ref 5.0–8.0)

## 2022-06-24 LAB — COMPREHENSIVE METABOLIC PANEL
ALT: 55 U/L — ABNORMAL HIGH (ref 0–44)
AST: 153 U/L — ABNORMAL HIGH (ref 15–41)
Albumin: 4.3 g/dL (ref 3.5–5.0)
Alkaline Phosphatase: 49 U/L (ref 38–126)
Anion gap: 13 (ref 5–15)
BUN: 7 mg/dL (ref 6–20)
CO2: 24 mmol/L (ref 22–32)
Calcium: 8.7 mg/dL — ABNORMAL LOW (ref 8.9–10.3)
Chloride: 95 mmol/L — ABNORMAL LOW (ref 98–111)
Creatinine, Ser: 0.59 mg/dL (ref 0.44–1.00)
GFR, Estimated: >60 mL/min (ref >60)
Glucose, Bld: 108 mg/dL — ABNORMAL HIGH (ref 70–99)
Potassium: 3.1 mmol/L — ABNORMAL LOW (ref 3.5–5.1)
Sodium: 132 mmol/L — ABNORMAL LOW (ref 135–145)
Total Bilirubin: 1.9 mg/dL — ABNORMAL HIGH (ref 0.3–1.2)
Total Protein: 7.2 g/dL (ref 6.5–8.1)

## 2022-06-24 LAB — DIFFERENTIAL
Abs Immature Granulocytes: 0.02 10*3/uL (ref 0.00–0.07)
Basophils Absolute: 0 10*3/uL (ref 0.0–0.1)
Basophils Relative: 1 %
Eosinophils Absolute: 0 10*3/uL (ref 0.0–0.5)
Eosinophils Relative: 1 %
Immature Granulocytes: 1 %
Lymphocytes Relative: 26 %
Lymphs Abs: 1 10*3/uL (ref 0.7–4.0)
Monocytes Absolute: 0.5 10*3/uL (ref 0.1–1.0)
Monocytes Relative: 13 %
Neutro Abs: 2.3 10*3/uL (ref 1.7–7.7)
Neutrophils Relative %: 58 %

## 2022-06-24 LAB — CBC
HCT: 35.4 % — ABNORMAL LOW (ref 36.0–46.0)
Hemoglobin: 12.7 g/dL (ref 12.0–15.0)
MCH: 39.8 pg — ABNORMAL HIGH (ref 26.0–34.0)
MCHC: 35.9 g/dL (ref 30.0–36.0)
MCV: 111 fL — ABNORMAL HIGH (ref 80.0–100.0)
Platelets: 190 10*3/uL (ref 150–400)
RBC: 3.19 MIL/uL — ABNORMAL LOW (ref 3.87–5.11)
RDW: 15.1 % (ref 11.5–15.5)
WBC: 3.9 10*3/uL — ABNORMAL LOW (ref 4.0–10.5)
nRBC: 0 % (ref 0.0–0.2)

## 2022-06-24 LAB — URINALYSIS, MICROSCOPIC (REFLEX)

## 2022-06-24 LAB — RAPID URINE DRUG SCREEN, HOSP PERFORMED
Amphetamines: NOT DETECTED
Barbiturates: NOT DETECTED
Benzodiazepines: NOT DETECTED
Cocaine: NOT DETECTED
Opiates: NOT DETECTED
Tetrahydrocannabinol: NOT DETECTED

## 2022-06-24 LAB — PROTIME-INR
INR: 1 (ref 0.8–1.2)
Prothrombin Time: 13.5 seconds (ref 11.4–15.2)

## 2022-06-24 LAB — PREGNANCY, URINE: Preg Test, Ur: NEGATIVE

## 2022-06-24 LAB — APTT: aPTT: 24 seconds (ref 24–36)

## 2022-06-24 MED ORDER — POTASSIUM CHLORIDE CRYS ER 20 MEQ PO TBCR
40.0000 meq | EXTENDED_RELEASE_TABLET | Freq: Once | ORAL | Status: AC
  Administered 2022-06-24: 40 meq via ORAL
  Filled 2022-06-24: qty 2

## 2022-06-24 NOTE — ED Notes (Signed)
Patient transported to CT 

## 2022-06-24 NOTE — Discharge Instructions (Signed)
You were seen in the emergency department for an episode of slurred speech and left hand spasm followed by some tingling in your hands.  You had a CAT scan lab work EKG urinalysis.  There was no evidence of acute stroke.  This is a limited exam and will need follow-up with your neurologist for further testing.  Your potassium was low and your liver enzymes were elevated.  This will also need follow-up with your primary care doctor.  Please continue your regular medications.  Return to the emergency department if any worsening or concerning symptoms.

## 2022-06-24 NOTE — ED Provider Notes (Signed)
[facility] Provider Note   CSN: [id number] Arrival date & time: 06/24/22  1812     History {Add pertinent medical, surgical, social history, OB history to HPI:1} Chief Complaint  Patient presents with   Tingling    Stacey Pena is a 42 y.o. female.  She has a history of seizure disorder and is on Lamictal.  History of migraine.  She said she was having trouble writing starting around 2 PM.  While she was talking on the phone at 430 she could not get her words out and her left hand spasmed.  This lasted for a few minutes.  Since then she has had some tingling in her hands and she feels generally shaky.  No blurry vision double vision.  She has never had symptoms like this before.  She had a history of a grand mal seizure about 4 years ago and has been on Lamictal since then.  No new medications no recent illness.  The history is provided by the patient and the spouse.  Cerebrovascular Accident This is a new problem. The current episode started 1 to 2 hours ago. The problem has been resolved. Pertinent negatives include no chest pain, no abdominal pain, no headaches and no shortness of breath. Nothing aggravates the symptoms. Nothing relieves the symptoms. She has tried nothing for the symptoms. The treatment provided significant relief.       Home Medications Prior to Admission medications   Medication Sig Start Date End Date Taking? Authorizing Provider  fenofibrate (TRICOR) 48 MG tablet TAKE ONE (1) TABLET BY MOUTH EACH DAY 10/21/21   Donato Heinz, MD  HAILEY 24 FE 1-20 MG-MCG(24) tablet Take 1 tablet by mouth daily. 01/26/22   [provider]  ibuprofen (ADVIL,MOTRIN) 200 MG tablet Take 200 mg by mouth daily as needed.    [provider]  levETIRAcetam (KEPPRA) 500 MG tablet Take 2 tablets (1,000 mg total) by mouth 2 (two) times daily. 02/25/22   Suzzanne Cloud, NP  Multiple Vitamin (MULTIVITAMIN PO) Take by  mouth.    [provider]  Omega-3 Fatty Acids (FISH OIL) 1000 MG CAPS Take 2,000 mg by mouth daily.    [provider]  ondansetron (ZOFRAN) 4 MG tablet Take 4 mg by mouth every 8 (eight) hours as needed for nausea or vomiting.    [provider]  pantoprazole (PROTONIX) 40 MG tablet Take 40 mg by mouth as needed.    [provider]  Potassium Chloride ER 20 MEQ TBCR TAKE 1 TABLET BY MOUTH EVERY DAY 10/23/21   Donato Heinz, MD  zolmitriptan (ZOMIG) 5 MG tablet TAKE 1 TABLET BY MOUTH AT ONSET OF MIGRAINE. MAY REPEAT IN 2 HOURS IF NEEDED. MAX OF 2 TABLETS PER DAY & 12 TABLETS PER MONTH 02/25/22   Suzzanne Cloud, NP      Allergies    Lamictal [lamotrigine], Sumatriptan, and Gadolinium derivatives    Review of Systems   Review of Systems  Constitutional:  Negative for fever.  HENT:  Negative for sore throat.   Eyes:  Negative for visual disturbance.  Respiratory:  Negative for shortness of breath.   Cardiovascular:  Negative for chest pain.  Gastrointestinal:  Negative for abdominal pain.  Genitourinary:  Negative for dysuria.  Musculoskeletal:  Negative for neck pain.  Skin:  Negative for rash.  Neurological:  Positive for speech difficulty and numbness. Negative for headaches.    Physical Exam Updated Vital Signs  BP (!) 139/95 (BP Location: Right Arm)   Pulse 84   Temp 98 F (36.7 C)   Resp 20   SpO2 98%  Physical Exam Vitals and nursing note reviewed.  Constitutional:      General: She is not in acute distress.    Appearance: Normal appearance. She is well-developed.  HENT:     Head: Normocephalic and atraumatic.  Eyes:     Conjunctiva/sclera: Conjunctivae normal.  Cardiovascular:     Rate and Rhythm: Normal rate and regular rhythm.     Heart sounds: No murmur heard. Pulmonary:     Effort: Pulmonary effort is normal. No respiratory distress.     Breath sounds: Normal breath sounds.  Abdominal:     Palpations: Abdomen is  soft.     Tenderness: There is no abdominal tenderness. There is no guarding or rebound.  Musculoskeletal:        General: No deformity. Normal range of motion.     Cervical back: Neck supple.  Skin:    General: Skin is warm and dry.     Capillary Refill: Capillary refill takes less than 2 seconds.  Neurological:     General: No focal deficit present.     Mental Status: She is alert and oriented to person, place, and time.     Cranial Nerves: No cranial nerve deficit.     Sensory: No sensory deficit.     Motor: No weakness.     ED Results / Procedures / Treatments   Labs (all labs ordered are listed, but only abnormal results are displayed) Labs Reviewed - No data to display  EKG None  Radiology No results found.  Procedures Procedures  {Document cardiac monitor, telemetry assessment procedure when appropriate:1}  Medications Ordered in ED Medications - No data to display  ED Course/ Medical Decision Making/ A&P   {   Click here for ABCD2, HEART and other calculatorsREFRESH Note before signing :1}                          Medical Decision Making  This patient complains of ***; this involves an extensive number of treatment Options and is a complaint that carries with it a high risk of complications and morbidity. The differential includes ***  I ordered, reviewed and interpreted labs, which included *** I ordered medication *** and reviewed PMP when indicated. I ordered imaging studies which included *** and I independently    visualized and interpreted imaging which showed *** Additional history obtained from *** Previous records obtained and reviewed *** I consulted *** and discussed lab and imaging findings and discussed disposition.  Cardiac monitoring reviewed, *** Social determinants considered, *** Critical Interventions: ***  After the interventions stated above, I reevaluated the patient and found *** Admission and further testing considered,  ***   {Document critical care time when appropriate:1} {Document review of labs and clinical decision tools ie heart score, Chads2Vasc2 etc:1}  {Document your independent review of radiology images, and any outside records:1} {Document your discussion with family members, caretakers, and with consultants:1} {Document social determinants of health affecting pt's care:1} {Document your decision making why or why not admission, treatments were needed:1} Final Clinical Impression(s) / ED Diagnoses Final diagnoses:  None    Rx / DC Orders ED Discharge Orders     None

## 2022-06-24 NOTE — ED Notes (Signed)
Discharge paperwork reviewed entirely with patient, including Rx's and follow up care. Pain was under control. Pt verbalized understanding as well as all parties involved. No questions or concerns voiced at the time of discharge. No acute distress noted.   Pt ambulated out to PVA without incident or assistance.  

## 2022-06-24 NOTE — ED Triage Notes (Addendum)
Patient presents to ED via POV from home. Patient reports at 1630 she was on the phone with a client when her speech began to slur and her left arm was tingling. Slurred speech last a minute but tingling remains, now in both arms. Ambulatory with a steady gait.

## 2022-07-01 ENCOUNTER — Telehealth: Payer: Self-pay | Admitting: Neurology

## 2022-07-01 ENCOUNTER — Ambulatory Visit (INDEPENDENT_AMBULATORY_CARE_PROVIDER_SITE_OTHER): Payer: No Typology Code available for payment source | Admitting: Neurology

## 2022-07-01 ENCOUNTER — Encounter: Payer: Self-pay | Admitting: Neurology

## 2022-07-01 VITALS — BP 126/85 | HR 75 | Ht 65.0 in | Wt 171.4 lb

## 2022-07-01 DIAGNOSIS — R569 Unspecified convulsions: Secondary | ICD-10-CM | POA: Diagnosis not present

## 2022-07-01 DIAGNOSIS — G43709 Chronic migraine without aura, not intractable, without status migrainosus: Secondary | ICD-10-CM | POA: Diagnosis not present

## 2022-07-01 DIAGNOSIS — F809 Developmental disorder of speech and language, unspecified: Secondary | ICD-10-CM | POA: Diagnosis not present

## 2022-07-01 MED ORDER — LEVETIRACETAM 500 MG PO TABS: ORAL_TABLET | ORAL | 3 refills | Status: DC

## 2022-07-01 NOTE — Progress Notes (Signed)
ASSESSMENT AND PLAN 42 y.o. year old female   Seizure  Probable complex partial seizure, last witnessed was on Jan 5th 2019  In 2022, while under a lot of stress, had recurrent spells of waking up from sleep, body jerking, teeth clenching, her father had developed nocturnal seizure, her brother died from sleep for unknown reasons (he did have a history of seizure prior to that), the reported episode make her worry she might suffer nocturnal seizure,   Multiple previous regular EEG showed no significant abnormality, prolonged video EEG monitoring never carried through,  Lamotrigine causing rash  On Keppra 1000 mg twice a day while she was having a spell on June 24, 2022 leading to ER visit,  Most worrisome for partial seizure, increase Keppra to 500 mg 2/3, check level,  Repeat EEG  Patient worried about the possibility of stroke, MRI of brain   Chronic migraine headache  Zomig as needed  DIAGNOSTIC DATA (LABS, IMAGING, TESTING) - I reviewed patient records, labs, notes, testing and imaging myself where available.   HISTORY OF PRESENT ILLNESS:  Stacey Pena is a 42 year old female, accompanied by her husband, seen in refer by primary care doctor Marin Comment, Thao P, for evaluation of seizure,Initial evaluation was on May 31, 2017.   She was previously healthy, deny a personal family history of seizure   On May 22, 2017, while shopping with her daughter in the mall, without warning signs, she suddenly raised her right arm, sliding to the floor, then had a witnessed generalized tonic-clonic seizure, no incontinence, with right lateral tongue biting.  Seizure last about 2 minutes, [facility],   laboratory evaluations normal CBC, hemoglobin 12.9, CMP, creatinine 0.6, CT head without contrast showed no acute abnormality   She recently started a new job   She had frequent abdominal pain, GI symptoms since summer of 2018, MRI of abdomen in May 2018  showed multiple enhancing hepatic lesions in both lobes, measuring up to 2.8 cm, favoring the diagnosis of [place],   Update July 12, 2017:   MRI of the brain with and without contrast was normal on June 13, 2017.   EEG in January 2019 showed evidence of mild background slowing,   She has tried lamotrigine, cause rash, she is now taking Keppra 500 mg twice a day, tolerating it was normal well.   UPDATE December 30 2017: She complains of frequent right-sided headaches since July 2019, about 3-4 times each week, right retro-orbital, moderate pain, she tends to close her eyes which helps her headaches sound, oftentimes is getting worse as day goes on,   She has no recurrent seizure   UPDATE Jul 13 2018: She overall is doing very well, there was no recurrent seizure tolerating Keppra 500 mg twice a day, has intermittent migraine headaches every 2 to 3 weeks, complains of nausea with Imitrex, taking ibuprofen as needed works well for her most of the time   UPDATE Jul 03 2019: She is doing very well, no recurrent seizure, taking keppra 500/1000mg,her frequent migraine has improved, but still has intermittent sensitivity on the right side, complains of nausea with Imitrex,  UPDATE Sep 26 2020:  She complains of excessive stress recently, increased migraine to couple times each month, suboptimal response to Maxalt, which often make her feel tired, previously did not respond well to Imitrex, will try Zomig at this time  Her father developed nocturnal seizure in his 33s, she has a brother died from sleep  for unknown reasons, he did have a spell with tongue biting motor vehicle accident prior to sudden pass away.  Patient reported in the past few months, couple times each week, she woke up from sleep felt body jerking, tight, teeth clenching, those episode make her worried about nocturnal seizure, desire further evaluation, she is now taking Keppra 500/1000 mg  UPDATE Jul 01 2022: Previous visit  with Judson Roch, while she was under a lot of stress, she complains of waking jumping out of sleep, gasping, right side teeth clench, but no recurrent seizure activity,  She was taking Keppra 1000 mg twice a day, on June 24, 2022, while at work, as a Radiation protection practitioner, on the phone, she suddenly developed word finding difficulty, stuttering of her speech, left hand muscle spasm, posturing, lasting for few minutes, prior to that, she already have intermittent bilateral hands paresthesia,  She presented to the emergency room the same day, but then she no longer have posturing or language difficulty, CT head showed no significant abnormality  Laboratory evaluation showed negative UDS, elevated AST 153, ALT 55, total bilirubin 1.9, calcium 8.7, potassium 3.1, she was told to have fatty liver in the past, only drinks alcohol occasionally  She denies significant headache while she was having above spells, her migraine usually does not proceed with aura,   PHYSICAL EXAM  Vitals:   09/26/20 0726  BP: 121/86  Pulse: 95  Weight: 175 lb (79.4 kg)  Height: 5' 5"$  (1.651 m)   Body mass index is 29.12 kg/m.  Generalized: Well developed, in no acute distress    PHYSICAL EXAMNIATION:  Gen: NAD, conversant, well nourised, well groomed                     Cardiovascular: Regular rate rhythm, no peripheral edema, warm, nontender. Eyes: Conjunctivae clear without exudates or hemorrhage Neck: Supple, no carotid bruits. Pulmonary: Clear to auscultation bilaterally   NEUROLOGICAL EXAM:  MENTAL STATUS: Speech/Cognition: Awake, alert, normal speech, oriented to history taking and casual conversation.  CRANIAL NERVES: CN II: Visual fields are full to confrontation.  Pupils are round equal and briskly reactive to light. CN III, IV, VI: extraocular movement are normal. No ptosis. CN V: Facial sensation is intact to light touch. CN VII: Face is symmetric with normal eye closure and  smile. CN VIII: Hearing is normal to casual conversation CN IX, X: Palate elevates symmetrically. Phonation is normal. CN XI: Head turning and shoulder shrug are intact CN XII: Tongue is midline with normal movements and no atrophy.  MOTOR: Muscle bulk and tone are normal. Muscle strength is normal.  REFLEXES: Reflexes are 2  and symmetric at the biceps, triceps, knees and ankles. Plantar responses are flexor.  SENSORY: Intact to light touch, pinprick, positional and vibratory sensation at fingers and toes.  COORDINATION: There is no trunk or limb ataxia.    GAIT/STANCE: Posture is normal. Gait is steady with normal steps, base, arm swing and turning.    REVIEW OF SYSTEMS: Out of a complete 14 system review of symptoms, the patient complains only of the following symptoms, and all other reviewed systems are negative.  See HPI  ALLERGIES: Allergies  Allergen Reactions   Lamictal [Lamotrigine] Rash   Sumatriptan Nausea And Vomiting   Gadolinium Derivatives Rash    Pt had a rash across her chest following her injection of Multihance.  Rash assessed by Dr. Thornton Papas, pt given 46m grams taken immediately and released with instructions to call if  anything new arose.      HOME MEDICATIONS: Outpatient Medications Prior to Visit  Medication Sig Dispense Refill   fenofibrate (TRICOR) 48 MG tablet TAKE ONE (1) TABLET BY MOUTH EACH DAY 90 tablet 3   Ferrous Sulfate (IRON PO) Take 1 tablet by mouth daily.     ibuprofen (ADVIL,MOTRIN) 200 MG tablet Take 200 mg by mouth daily as needed.     levETIRAcetam (KEPPRA) 500 MG tablet Take 2 tablets (1,000 mg total) by mouth 2 (two) times daily. 360 tablet 4   Multiple Vitamin (MULTIVITAMIN PO) Take by mouth.     norethindrone (MICRONOR) 0.35 MG tablet Take 1 tablet by mouth daily.     Omega-3 Fatty Acids (FISH OIL) 1000 MG CAPS Take 2,000 mg by mouth daily.     ondansetron (ZOFRAN) 4 MG tablet Take 4 mg by mouth every 8 (eight) hours as needed  for nausea or vomiting.     pantoprazole (PROTONIX) 40 MG tablet Take 40 mg by mouth as needed.     Potassium Chloride ER 20 MEQ TBCR TAKE 1 TABLET BY MOUTH EVERY DAY 90 tablet 2   Probiotic Product (PROBIOTIC BLEND PO) Take 1 tablet by mouth daily.     zolmitriptan (ZOMIG) 5 MG tablet TAKE 1 TABLET BY MOUTH AT ONSET OF MIGRAINE. MAY REPEAT IN 2 HOURS IF NEEDED. MAX OF 2 TABLETS PER DAY & 12 TABLETS PER MONTH 12 tablet 5   HAILEY 24 FE 1-20 MG-MCG(24) tablet Take 1 tablet by mouth daily.     No facility-administered medications prior to visit.    PAST MEDICAL HISTORY: Past Medical History:  Diagnosis Date   Seizures ([place])     PAST SURGICAL HISTORY: History reviewed. No pertinent surgical history.  FAMILY HISTORY: History reviewed. No pertinent family history.  SOCIAL HISTORY: Social History   Socioeconomic History   Marital status: Married    Spouse name: Not on file   Number of children: Not on file   Years of education: Not on file   Highest education level: Not on file  Occupational History   Not on file  Tobacco Use   Smoking status: Former   Smokeless tobacco: Never  Substance and Sexual Activity   Alcohol use: Yes    Comment: 2-3 nights/wk   Drug use: No   Sexual activity: Not on file  Other Topics Concern   Not on file  Social History Narrative   ** Merged History Encounter **       Social Determinants of Health   Financial Resource Strain: Not on file  Food Insecurity: Not on file  Transportation Needs: Not on file  Physical Activity: Not on file  Stress: Not on file  Social Connections: Not on file  Intimate Partner Violence: Not on file    Marcial Pacas, M.D. Ph.D.  [facility] [place], [place] [address] Phone: [phone] Fax:      [phone]

## 2022-07-01 NOTE — Telephone Encounter (Signed)
[employer] sent to [facility] they obtain auth [id number]

## 2022-07-02 LAB — COMPREHENSIVE METABOLIC PANEL
ALT: 35 IU/L — ABNORMAL HIGH (ref 0–32)
AST: 31 IU/L (ref 0–40)
Albumin/Globulin Ratio: 2.1 (ref 1.2–2.2)
Albumin: 4.2 g/dL (ref 3.9–4.9)
Alkaline Phosphatase: 53 IU/L (ref 44–121)
BUN/Creatinine Ratio: 22 (ref 9–23)
BUN: 11 mg/dL (ref 6–24)
Bilirubin Total: 0.3 mg/dL (ref 0.0–1.2)
CO2: 22 mmol/L (ref 20–29)
Calcium: 9.9 mg/dL (ref 8.7–10.2)
Chloride: 102 mmol/L (ref 96–106)
Creatinine, Ser: 0.5 mg/dL — ABNORMAL LOW (ref 0.57–1.00)
Globulin, Total: 2 g/dL (ref 1.5–4.5)
Glucose: 96 mg/dL (ref 70–99)
Potassium: 3.9 mmol/L (ref 3.5–5.2)
Sodium: 139 mmol/L (ref 134–144)
Total Protein: 6.2 g/dL (ref 6.0–8.5)
eGFR: 122 mL/min/{1.73_m2} (ref >59)

## 2022-07-02 LAB — LEVETIRACETAM LEVEL: Levetiracetam Lvl: 15.6 ug/mL (ref 10.0–40.0)

## 2022-07-02 LAB — TSH: TSH: 2.01 u[IU]/mL (ref 0.450–4.500)

## 2022-07-08 LAB — ACUTE HEP PANEL AND HEP B SURFACE AB
Hep A IgM: NEGATIVE
Hep B C IgM: NEGATIVE
Hep C Virus Ab: NONREACTIVE
Hepatitis B Surf Ab Quant: <3.1 m[IU]/mL — ABNORMAL LOW (ref >9.9)
Hepatitis B Surface Ag: NEGATIVE

## 2022-07-13 ENCOUNTER — Telehealth: Payer: Self-pay | Admitting: Neurology

## 2022-07-13 MED ORDER — ALPRAZOLAM 0.5 MG PO TABS: ORAL_TABLET | ORAL | 0 refills | Status: AC

## 2022-07-13 MED ORDER — ALPRAZOLAM 0.5 MG PO TABS: ORAL_TABLET | ORAL | 0 refills | Status: DC

## 2022-07-13 NOTE — Addendum Note (Signed)
Addended by: Marcial Pacas on: 07/13/2022 04:38 PM   Modules accepted: Orders

## 2022-07-13 NOTE — Addendum Note (Signed)
Addended by: Kristen Loader on: 07/13/2022 01:29 PM   Modules accepted: Orders

## 2022-07-13 NOTE — Addendum Note (Signed)
Addended by: Kristen Loader on: 07/13/2022 03:49 PM   Modules accepted: Orders

## 2022-07-13 NOTE — Telephone Encounter (Signed)
I have sent refill to Dr. Krista Blue for approval

## 2022-07-13 NOTE — Addendum Note (Signed)
Addended by: Marcial Pacas on: 07/13/2022 03:27 PM   Modules accepted: Orders

## 2022-07-13 NOTE — Telephone Encounter (Signed)
Meds ordered this encounter  Medications   DISCONTD: ALPRAZolam (XANAX) 0.5 MG tablet    Sig: Take 1-2 tablets thirty minutes prior to MRI.  May take one additional tablet before entering scanner, if needed.  MUST HAVE DRIVER.    Dispense:  3 tablet    Refill:  0   ALPRAZolam (XANAX) 0.5 MG tablet    Sig: Take 1-2 tablets thirty minutes prior to MRI.  May take one additional tablet before entering scanner, if needed.  MUST HAVE DRIVER.    Dispense:  3 tablet    Refill:  0

## 2022-07-13 NOTE — Telephone Encounter (Signed)
Pt is asking that the Xanax be called into [facility] -  for her upcoming MRI

## 2022-07-13 NOTE — Telephone Encounter (Signed)
Meds ordered this encounter  Medications   ALPRAZolam (XANAX) 0.5 MG tablet    Sig: Take 1-2 tablets thirty minutes prior to MRI.  May take one additional tablet before entering scanner, if needed.  MUST HAVE DRIVER.    Dispense:  3 tablet    Refill:  0

## 2022-07-14 ENCOUNTER — Encounter: Payer: Self-pay | Admitting: Neurology

## 2022-07-15 ENCOUNTER — Encounter: Payer: Self-pay | Admitting: Neurology

## 2022-07-15 ENCOUNTER — Other Ambulatory Visit: Payer: No Typology Code available for payment source | Admitting: *Deleted

## 2022-07-16 ENCOUNTER — Other Ambulatory Visit: Payer: No Typology Code available for payment source | Admitting: *Deleted

## 2022-07-19 ENCOUNTER — Ambulatory Visit
Admission: RE | Admit: 2022-07-19 | Discharge: 2022-07-19 | Disposition: A | Discharge status: Home / Self Care | Payer: No Typology Code available for payment source | Source: Ambulatory Visit | Attending: Neurology | Admitting: Neurology

## 2022-07-19 DIAGNOSIS — G43709 Chronic migraine without aura, not intractable, without status migrainosus: Secondary | ICD-10-CM

## 2022-07-19 DIAGNOSIS — R569 Unspecified convulsions: Secondary | ICD-10-CM

## 2022-07-19 DIAGNOSIS — F809 Developmental disorder of speech and language, unspecified: Secondary | ICD-10-CM

## 2022-07-21 ENCOUNTER — Inpatient Hospital Stay: Payer: No Typology Code available for payment source | Admitting: Neurology

## 2022-07-21 ENCOUNTER — Ambulatory Visit: Payer: No Typology Code available for payment source | Admitting: Neurology

## 2022-07-21 DIAGNOSIS — F809 Developmental disorder of speech and language, unspecified: Secondary | ICD-10-CM

## 2022-07-21 DIAGNOSIS — G43709 Chronic migraine without aura, not intractable, without status migrainosus: Secondary | ICD-10-CM

## 2022-07-21 DIAGNOSIS — R569 Unspecified convulsions: Secondary | ICD-10-CM

## 2022-07-29 ENCOUNTER — Telehealth: Payer: Self-pay | Admitting: *Deleted

## 2022-07-29 NOTE — Telephone Encounter (Signed)
Pt called for eeg results.Please call [phone]

## 2022-07-29 NOTE — Telephone Encounter (Signed)
I sent MyChart message EEG was normal

## 2022-07-29 NOTE — Telephone Encounter (Signed)
Called pt and informed her of message nurse Katie sent, that once results are read she will receive a call.

## 2022-07-29 NOTE — Procedures (Signed)
   HISTORY: 42 year old female presenting with seizure  TECHNIQUE:  This is a routine 16 channel EEG recording with one channel devoted to a limited EKG recording.  It was performed during wakefulness, drowsiness and asleep.  Hyperventilation and photic stimulation were performed as activating procedures.  There are minimum muscle and movement artifact noted.  Upon maximum arousal, posterior dominant waking rhythm consistent of rhythmic alpha range activity. Activities are symmetric over the bilateral posterior derivations and attenuated with eye opening.  Hyperventilation produced mild/moderate buildup with higher amplitude and the slower activities noted.  Photic stimulation did not alter the tracing.  During EEG recording, patient developed drowsiness and no deeper stage of sleep was achieved  During EEG recording, there was no epileptiform discharge noted.  EKG demonstrate normal sinus rhythm.  CONCLUSION: This is a  normal awake EEG.  There is no electrodiagnostic evidence of epileptiform discharge.  Marcial Pacas, M.D. Ph.D.  [facility] [place], [place] [address] Phone: [phone] Fax:      [phone]

## 2022-08-30 ENCOUNTER — Encounter (HOSPITAL_COMMUNITY): Payer: Self-pay | Admitting: Emergency Medicine

## 2022-08-30 ENCOUNTER — Other Ambulatory Visit: Payer: Self-pay

## 2022-08-30 ENCOUNTER — Emergency Department (HOSPITAL_COMMUNITY)
Admission: EM | Admit: 2022-08-30 | Discharge: 2022-08-31 | Disposition: A | Discharge status: Home / Self Care | Payer: No Typology Code available for payment source | Attending: Emergency Medicine | Admitting: Emergency Medicine

## 2022-08-30 DIAGNOSIS — E871 Hypo-osmolality and hyponatremia: Secondary | ICD-10-CM

## 2022-08-30 DIAGNOSIS — E876 Hypokalemia: Secondary | ICD-10-CM

## 2022-08-30 DIAGNOSIS — R479 Unspecified speech disturbances: Secondary | ICD-10-CM | POA: Diagnosis present

## 2022-08-30 DIAGNOSIS — R251 Tremor, unspecified: Secondary | ICD-10-CM | POA: Diagnosis not present

## 2022-08-30 DIAGNOSIS — D7589 Other specified diseases of blood and blood-forming organs: Secondary | ICD-10-CM

## 2022-08-30 DIAGNOSIS — R7401 Elevation of levels of liver transaminase levels: Secondary | ICD-10-CM

## 2022-08-30 DIAGNOSIS — R569 Unspecified convulsions: Secondary | ICD-10-CM

## 2022-08-30 LAB — COMPREHENSIVE METABOLIC PANEL
ALT: 31 U/L (ref 0–44)
AST: 106 U/L — ABNORMAL HIGH (ref 15–41)
Albumin: 3.8 g/dL (ref 3.5–5.0)
Alkaline Phosphatase: 47 U/L (ref 38–126)
Anion gap: 14 (ref 5–15)
BUN: 8 mg/dL (ref 6–20)
CO2: 22 mmol/L (ref 22–32)
Calcium: 8.5 mg/dL — ABNORMAL LOW (ref 8.9–10.3)
Chloride: 98 mmol/L (ref 98–111)
Creatinine, Ser: 0.63 mg/dL (ref 0.44–1.00)
GFR, Estimated: >60 mL/min (ref >60)
Glucose, Bld: 113 mg/dL — ABNORMAL HIGH (ref 70–99)
Potassium: 3.2 mmol/L — ABNORMAL LOW (ref 3.5–5.1)
Sodium: 134 mmol/L — ABNORMAL LOW (ref 135–145)
Total Bilirubin: 2.3 mg/dL — ABNORMAL HIGH (ref 0.3–1.2)
Total Protein: 5.9 g/dL — ABNORMAL LOW (ref 6.5–8.1)

## 2022-08-30 LAB — URINALYSIS, ROUTINE W REFLEX MICROSCOPIC
Bilirubin Urine: NEGATIVE
Glucose, UA: NEGATIVE mg/dL
Hgb urine dipstick: NEGATIVE
Ketones, ur: NEGATIVE mg/dL
Nitrite: NEGATIVE
Protein, ur: NEGATIVE mg/dL
Specific Gravity, Urine: 1.003 — ABNORMAL LOW (ref 1.005–1.030)
pH: 6 (ref 5.0–8.0)

## 2022-08-30 LAB — CBC WITH DIFFERENTIAL/PLATELET
Abs Immature Granulocytes: 0.02 10*3/uL (ref 0.00–0.07)
Basophils Absolute: 0.1 10*3/uL (ref 0.0–0.1)
Basophils Relative: 1 %
Eosinophils Absolute: 0 10*3/uL (ref 0.0–0.5)
Eosinophils Relative: 1 %
HCT: 35.6 % — ABNORMAL LOW (ref 36.0–46.0)
Hemoglobin: 12.1 g/dL (ref 12.0–15.0)
Immature Granulocytes: 0 %
Lymphocytes Relative: 29 %
Lymphs Abs: 1.6 10*3/uL (ref 0.7–4.0)
MCH: 36.9 pg — ABNORMAL HIGH (ref 26.0–34.0)
MCHC: 34 g/dL (ref 30.0–36.0)
MCV: 108.5 fL — ABNORMAL HIGH (ref 80.0–100.0)
Monocytes Absolute: 0.5 10*3/uL (ref 0.1–1.0)
Monocytes Relative: 9 %
Neutro Abs: 3.4 10*3/uL (ref 1.7–7.7)
Neutrophils Relative %: 60 %
Platelets: 205 10*3/uL (ref 150–400)
RBC: 3.28 MIL/uL — ABNORMAL LOW (ref 3.87–5.11)
RDW: 15.4 % (ref 11.5–15.5)
WBC: 5.6 10*3/uL (ref 4.0–10.5)
nRBC: 0 % (ref 0.0–0.2)

## 2022-08-30 LAB — RAPID URINE DRUG SCREEN, HOSP PERFORMED
Amphetamines: NOT DETECTED
Barbiturates: NOT DETECTED
Benzodiazepines: NOT DETECTED
Cocaine: NOT DETECTED
Opiates: NOT DETECTED
Tetrahydrocannabinol: NOT DETECTED

## 2022-08-30 LAB — I-STAT BETA HCG BLOOD, ED (MC, WL, AP ONLY): I-stat hCG, quantitative: <5 m[IU]/mL (ref <5)

## 2022-08-30 LAB — MAGNESIUM: Magnesium: 1.4 mg/dL — ABNORMAL LOW (ref 1.7–2.4)

## 2022-08-30 MED ORDER — POTASSIUM CHLORIDE CRYS ER 20 MEQ PO TBCR
40.0000 meq | EXTENDED_RELEASE_TABLET | Freq: Once | ORAL | Status: AC
  Administered 2022-08-31: 40 meq via ORAL
  Filled 2022-08-30: qty 2

## 2022-08-30 MED ORDER — MAGNESIUM SULFATE IN D5W 1-5 GM/100ML-% IV SOLN
1.0000 g | Freq: Once | INTRAVENOUS | Status: AC
  Administered 2022-08-31: 1 g via INTRAVENOUS
  Filled 2022-08-30: qty 100

## 2022-08-30 NOTE — ED Provider Notes (Signed)
[place] EMERGENCY DEPARTMENT AT [facility] Provider Note   CSN: [id number] Arrival date & time: 08/30/22  1952     History  Chief Complaint  Patient presents with   Seizures    Stacey Pena is a 42 y.o. female.  HPI   42 year old female with past medical history of migraines, epilepsy, on Keppra presents to the emergency department after an episode of speech difficulty.  Patient states that she was sitting with her family when she had an episode where she could not speak.  She states that she knew exactly what she wanted to say but the words would not come out.  This was also associated with tremors of her bilateral hands.  Patient had a similar episode to this about a month ago.  She currently follows with neurology as an outpatient.  Her Keppra was recently increased.  She states about a month ago when this happened her potassium was low and she feels like that was related to the episode.  Otherwise she is back to baseline, she has no complaints.  No headache, no focal weakness/numbness.   Home Medications Prior to Admission medications   Medication Sig Start Date End Date Taking? Authorizing Provider  ALPRAZolam Prudy Feeler) 0.5 MG tablet Take 1-2 tablets thirty minutes prior to MRI.  May take one additional tablet before entering scanner, if needed.  MUST HAVE DRIVER. 08/13/49   Levert Feinstein, MD  fenofibrate (TRICOR) 48 MG tablet TAKE ONE (1) TABLET BY MOUTH EACH DAY 10/21/21   Little Ishikawa, MD  Ferrous Sulfate (IRON PO) Take 1 tablet by mouth daily.    [provider]  ibuprofen (ADVIL,MOTRIN) 200 MG tablet Take 200 mg by mouth daily as needed.    [provider]  levETIRAcetam (KEPPRA) 500 MG tablet 2 at morning, 3 at night 07/01/22   Levert Feinstein, MD  Multiple Vitamin (MULTIVITAMIN PO) Take by mouth.    [provider]  norethindrone (MICRONOR) 0.35 MG tablet Take 1 tablet by mouth daily. 04/21/22   [provider]  Omega-3 Fatty  Acids (FISH OIL) 1000 MG CAPS Take 2,000 mg by mouth daily.    [provider]  ondansetron (ZOFRAN) 4 MG tablet Take 4 mg by mouth every 8 (eight) hours as needed for nausea or vomiting.    [provider]  pantoprazole (PROTONIX) 40 MG tablet Take 40 mg by mouth as needed.    [provider]  Potassium Chloride ER 20 MEQ TBCR TAKE 1 TABLET BY MOUTH EVERY DAY 10/23/21   Little Ishikawa, MD  Probiotic Product (PROBIOTIC BLEND PO) Take 1 tablet by mouth daily.    [provider]  zolmitriptan (ZOMIG) 5 MG tablet TAKE 1 TABLET BY MOUTH AT ONSET OF MIGRAINE. MAY REPEAT IN 2 HOURS IF NEEDED. MAX OF 2 TABLETS PER DAY & 12 TABLETS PER MONTH 02/25/22   Glean Salvo, NP      Allergies    Lamictal [lamotrigine], Sumatriptan, and Gadolinium derivatives    Review of Systems   Review of Systems  Constitutional:  Negative for fever.  Eyes:  Negative for visual disturbance.  Respiratory:  Negative for shortness of breath.   Cardiovascular:  Negative for chest pain.  Gastrointestinal:  Negative for abdominal pain.  Musculoskeletal:  Negative for back pain.  Neurological:  Positive for speech difficulty. Negative for dizziness, weakness, numbness and headaches.       Tremors    Physical Exam Updated Vital Signs BP 131/87  Pulse 86   Temp 98 F (36.7 C) (Oral)   Resp 17   Ht  (1.651 m)   Wt 78.5 kg   SpO2 98%   BMI 28.79 kg/m  Physical Exam Vitals and nursing note reviewed.  Constitutional:      General: She is not in acute distress.    Appearance: Normal appearance.  HENT:     Head: Normocephalic.     Mouth/Throat:     Mouth: Mucous membranes are moist.  Eyes:     Extraocular Movements: Extraocular movements intact.     Pupils: Pupils are equal, round, and reactive to light.  Cardiovascular:     Rate and Rhythm: Normal rate.  Pulmonary:     Effort: Pulmonary effort is normal. No respiratory distress.  Abdominal:     Palpations:  Abdomen is soft.     Tenderness: There is no abdominal tenderness.  Skin:    General: Skin is warm.  Neurological:     General: No focal deficit present.     Mental Status: She is alert and oriented to person, place, and time. Mental status is at baseline.     Cranial Nerves: No cranial nerve deficit.     Comments: Intermittent tremors of her bilateral upper extremities when she holds her hands out  Psychiatric:        Mood and Affect: Mood normal.     ED Results / Procedures / Treatments   Labs (all labs ordered are listed, but only abnormal results are displayed) Labs Reviewed  CBC WITH DIFFERENTIAL/PLATELET - Abnormal; Notable for the following components:      Result Value   RBC 3.28 (*)    HCT 35.6 (*)    MCV 108.5 (*)    MCH 36.9 (*)    All other components within normal limits  COMPREHENSIVE METABOLIC PANEL - Abnormal; Notable for the following components:   Sodium 134 (*)    Potassium 3.2 (*)    Glucose, Bld 113 (*)    Calcium 8.5 (*)    Total Protein 5.9 (*)    AST 106 (*)    Total Bilirubin 2.3 (*)    All other components within normal limits  MAGNESIUM - Abnormal; Notable for the following components:   Magnesium 1.4 (*)    All other components within normal limits  URINALYSIS, ROUTINE W REFLEX MICROSCOPIC - Abnormal; Notable for the following components:   Specific Gravity, Urine 1.003 (*)    Leukocytes,Ua TRACE (*)    Bacteria, UA RARE (*)    All other components within normal limits  RAPID URINE DRUG SCREEN, HOSP PERFORMED  I-STAT BETA HCG BLOOD, ED ([facility], [facility], [facility] ONLY)    EKG EKG Interpretation  Date/Time:  Sunday August 30 2022 20:02:20 EDT Ventricular Rate:  90 PR Interval:  170 QRS Duration: 101 QT Interval:  386 QTC Calculation: 473 R Axis:   193 Text Interpretation: Sinus rhythm Right axis deviation Low voltage, precordial leads Borderline T abnormalities, anterior leads Confirmed by Coralee Pesa ([id number]) on 08/30/2022 9:40:56  PM  Radiology No results found.  Procedures Procedures    Medications Ordered in ED Medications  potassium chloride SA (KLOR-CON M) CR tablet 40 mEq (has no administration in time range)  magnesium sulfate IVPB 1 g 100 mL (has no administration in time range)    ED Course/ Medical Decision Making/ A&P  Medical Decision Making Amount and/or Complexity of Data Reviewed Labs: ordered. Radiology: ordered.  Risk Prescription drug management.   42 year old female presents emergency department after an episode that she describes as aphasia.  Lasted about a minute.  Patient had a similar episode to this about 1 month ago. She was evaluated in the ER, negative head CT, was referred outpatient to neurology for TIA workup.  Had an MRI about a month ago that was unremarkable, her Keppra was increased.  In the department she is back to baseline, no ongoing acute neurodeficits.  Blood work is reassuring, shows a mild hypokalemia of 3.2, magnesium is 1.4, UDS is negative.  Patient is pending head CT. EKG is NSR.  Consulted on-call neurologist, Dr. Derry Lory.  He agrees that if the head CT is negative, given that she has had a similar episode before with a negative MRI that she can continue to follow-up as an outpatient with neurology.  I discussed this plan with the patient with her husband and son at bedside.  They agree.  Patient signed out pending head CT.  She is getting potassium and magnesium repletion.        Final Clinical Impression(s) / ED Diagnoses Final diagnoses:  None    Rx / DC Orders ED Discharge Orders     None         Rozelle Logan, DO 08/30/22 2346

## 2022-08-30 NOTE — ED Triage Notes (Signed)
Pt arrives to ED c/o seizure-like activity. Pt w/ hx of seizures, reports compliancy with Keppra. Pt reports that while sitting on couch she experienced tremors and aphasia that lasted 1-2 mins. Pt currently A/O x 4 with slight tremors in hands.

## 2022-08-30 NOTE — ED Provider Notes (Signed)
Care assumed from Dr. Wilkie Aye, patient with episode of aphasia and upper extremity shaking. She has already had outpatient workup. CT of head is pending, can be discharged if negative. She is getting potassium and magnesium repletion, will need to continue potassium and magnesium repletion as outpatient.  CT of head is normal.  I have independently viewed the images, and agree with radiologist interpretation.  I am discharging her with prescriptions for oral potassium and magnesium, follow-up with her neurologist.  Results for orders placed or performed during the hospital encounter of 08/30/22  CBC with Differential  Result Value Ref Range   WBC 5.6 4.0 - 10.5 K/uL   RBC 3.28 (L) 3.87 - 5.11 MIL/uL   Hemoglobin 12.1 12.0 - 15.0 g/dL   HCT 63.8 (L) 46.6 - 59.9 %   MCV 108.5 (H) 80.0 - 100.0 fL   MCH 36.9 (H) 26.0 - 34.0 pg   MCHC 34.0 30.0 - 36.0 g/dL   RDW 35.7 01.7 - 79.3 %   Platelets 205 150 - 400 K/uL   nRBC 0.0 0.0 - 0.2 %   Neutrophils Relative % 60 %   Neutro Abs 3.4 1.7 - 7.7 K/uL   Lymphocytes Relative 29 %   Lymphs Abs 1.6 0.7 - 4.0 K/uL   Monocytes Relative 9 %   Monocytes Absolute 0.5 0.1 - 1.0 K/uL   Eosinophils Relative 1 %   Eosinophils Absolute 0.0 0.0 - 0.5 K/uL   Basophils Relative 1 %   Basophils Absolute 0.1 0.0 - 0.1 K/uL   Immature Granulocytes 0 %   Abs Immature Granulocytes 0.02 0.00 - 0.07 K/uL  Comprehensive metabolic panel  Result Value Ref Range   Sodium 134 (L) 135 - 145 mmol/L   Potassium 3.2 (L) 3.5 - 5.1 mmol/L   Chloride 98 98 - 111 mmol/L   CO2 22 22 - 32 mmol/L   Glucose, Bld 113 (H) 70 - 99 mg/dL   BUN 8 6 - 20 mg/dL   Creatinine, Ser 9.03 0.44 - 1.00 mg/dL   Calcium 8.5 (L) 8.9 - 10.3 mg/dL   Total Protein 5.9 (L) 6.5 - 8.1 g/dL   Albumin 3.8 3.5 - 5.0 g/dL   AST 009 (H) 15 - 41 U/L   ALT 31 0 - 44 U/L   Alkaline Phosphatase 47 38 - 126 U/L   Total Bilirubin 2.3 (H) 0.3 - 1.2 mg/dL   GFR, Estimated >23 >30 mL/min   Anion gap 14 5 - 15   Magnesium  Result Value Ref Range   Magnesium 1.4 (L) 1.7 - 2.4 mg/dL  Urinalysis, Routine w reflex microscopic -Urine, Clean Catch  Result Value Ref Range   Color, Urine YELLOW YELLOW   APPearance CLEAR CLEAR   Specific Gravity, Urine 1.003 (L) 1.005 - 1.030   pH 6.0 5.0 - 8.0   Glucose, UA NEGATIVE NEGATIVE mg/dL   Hgb urine dipstick NEGATIVE NEGATIVE   Bilirubin Urine NEGATIVE NEGATIVE   Ketones, ur NEGATIVE NEGATIVE mg/dL   Protein, ur NEGATIVE NEGATIVE mg/dL   Nitrite NEGATIVE NEGATIVE   Leukocytes,Ua TRACE (A) NEGATIVE   RBC / HPF 0-5 0 - 5 RBC/hpf   WBC, UA 0-5 0 - 5 WBC/hpf   Bacteria, UA RARE (A) NONE SEEN   Squamous Epithelial / HPF 0-5 0 - 5 /HPF   Mucus PRESENT   Urine rapid drug screen (hosp performed)  Result Value Ref Range   Opiates NONE DETECTED NONE DETECTED   Cocaine NONE DETECTED NONE DETECTED  Benzodiazepines NONE DETECTED NONE DETECTED   Amphetamines NONE DETECTED NONE DETECTED   Tetrahydrocannabinol NONE DETECTED NONE DETECTED   Barbiturates NONE DETECTED NONE DETECTED  I-Stat beta hCG blood, ED  Result Value Ref Range   I-stat hCG, quantitative <5.0 <5 mIU/mL   Comment 3           CT Head Wo Contrast  Result Date: 08/31/2022 CLINICAL DATA:  Altered mental status EXAM: CT HEAD WITHOUT CONTRAST TECHNIQUE: Contiguous axial images were obtained from the base of the skull through the vertex without intravenous contrast. RADIATION DOSE REDUCTION: This exam was performed according to the departmental dose-optimization program which includes automated exposure control, adjustment of the mA and/or kV according to patient size and/or use of iterative reconstruction technique. COMPARISON:  CT head dated 06/24/2022 FINDINGS: Brain: No evidence of acute infarction, hemorrhage, hydrocephalus, extra-axial collection or mass lesion/mass effect. Vascular: No hyperdense vessel or unexpected calcification. Skull: Normal. Negative for fracture or focal lesion.  Sinuses/Orbits: The visualized paranasal sinuses are essentially clear. The mastoid air cells are unopacified. Other: None. IMPRESSION: Normal head CT. Electronically Signed   By: Charline Bills M.D.   On: 08/31/2022 01:51      Dione Booze, MD 08/31/22 [phone]

## 2022-08-31 ENCOUNTER — Telehealth: Payer: Self-pay | Admitting: Neurology

## 2022-08-31 ENCOUNTER — Emergency Department (HOSPITAL_COMMUNITY): Payer: No Typology Code available for payment source

## 2022-08-31 DIAGNOSIS — G43709 Chronic migraine without aura, not intractable, without status migrainosus: Secondary | ICD-10-CM

## 2022-08-31 DIAGNOSIS — F809 Developmental disorder of speech and language, unspecified: Secondary | ICD-10-CM

## 2022-08-31 DIAGNOSIS — R569 Unspecified convulsions: Secondary | ICD-10-CM

## 2022-08-31 MED ORDER — SLOW-MAG 71.5-119 MG PO TBEC: 1.0000 | DELAYED_RELEASE_TABLET | Freq: Two times a day (BID) | ORAL | 0 refills | Status: AC

## 2022-08-31 MED ORDER — POTASSIUM CHLORIDE ER 20 MEQ PO TBCR: 1.0000 | EXTENDED_RELEASE_TABLET | Freq: Two times a day (BID) | ORAL | 0 refills | Status: AC

## 2022-08-31 NOTE — Telephone Encounter (Signed)
Thank you :)

## 2022-08-31 NOTE — Telephone Encounter (Signed)
Reviewed her ER visit, called patient, complains sudden onset difficulty talking, tremor range, no loss of consciousness, lasting for 2 minutes leading to ER visit on August 30, 2022  CT head was normal  Laboratory evaluation showed low potassium 3.2, calcium 8.5, elevated AST 106, total bilirubin 2.3  Low normal hemoglobin of 12.1, magnesium was low 1.4, she was getting supplement at emergency room,  Reported compliant with her Keppra 502/3  Previous EEG was normal, last similar recurrence was a month ago  I am not convinced the above episodes represent partial seizure, keep current medications, 72 hours video EEG monitoring  Return to clinic in 1 to 2 months, following video EEG monitoring, repeat laboratory evaluation, including alcohol level, electrolyte,  Common etiology for hypokalemia, hypomagnesia and GI loss versus renal loss, she has intermittent diarrhea,  Previous ultrasound of abdomen showed 1.2 cm round hypoechoic echogenic mass at the right hepatic lobe,  MRI of abdomen May 2018, multiple enhancing hepatic lesions in both lobes, measuring up to 2.8 cm, favoring benign FNH (Focal nodular hyperplasia), not followed up recently  Suggest her continue follow-up with primary care physician, and follow-up with her liver problem

## 2022-08-31 NOTE — Telephone Encounter (Signed)
Faxed EEG orders to [facility] 

## 2022-08-31 NOTE — Telephone Encounter (Signed)
Pt reports that as of 3:00 this morning she was discharged from [facility], pt was told that her potassium and magnesium was low.  Pt is asking that Dr Terrace Arabia reads notes from the hospital and calls pt to discuss what she wants pt to do.

## 2022-09-25 DIAGNOSIS — R569 Unspecified convulsions: Secondary | ICD-10-CM | POA: Diagnosis not present

## 2022-10-01 ENCOUNTER — Other Ambulatory Visit: Payer: Self-pay | Admitting: Cardiology

## 2022-10-05 NOTE — Telephone Encounter (Signed)
Called and spoke to pt and let them know no problems or changes to plan and she voiced gratitude and understanding.

## 2022-10-05 NOTE — Telephone Encounter (Signed)
Patient called in said she did not have results of EEG and wanted to see if we had received them. I let her know that it looks like Dr. Terrace Arabia had sent her a MyChart message letting her know EEG came back normal with no significant abnormalities. She would like a call back from someone to verify this and go over results with her and what this means, if anything needs to change in her treatment plan based on these results. I let her know someone would be giving her a call back.

## 2022-10-08 ENCOUNTER — Other Ambulatory Visit: Payer: Self-pay | Admitting: Neurology

## 2022-10-08 DIAGNOSIS — R569 Unspecified convulsions: Secondary | ICD-10-CM

## 2022-10-08 NOTE — Procedures (Signed)
Clinical History : This is a 42 y/o F who presents with initial seizure in January of 2019. Previous EEG showed evidence of mild background slowing. Recently she complains of waking and jumping out of sleep, gasping, light teeth clenching but no recurrent seizure activity.  INTERMITTENT MONITORING with VIDEO TECHNICAL SUMMARY: This AVEEG was performed using equipment provided by [employer] utilizing Bluetooth ( [employer] ) amplifiers with continuous EEGT attended video collection using encrypted remote transmission via [employer] secured cellular tower network with data rates for each AVEEG performed. This is a Therapist, music AVEEG, obtained, according to the 10-20 international electrode placement system, reformatted digitally into referential and bipolar montages. Data was acquired with a minimum of 21 bipolar connections and sampled at a minimum rate of 250 cycles per second per channel, maximum rate of 450 cycles per second per channel and two channels for EKG. The entire VEEG study was recorded through cable and or radio telemetry for subsequent analysis. Specified epochs of the AVEEG data were identified at the direction of the subject by the depression of a push button by the patient. Each patients event file included data acquired two minutes prior to the push button activation and continuing until two minutes afterwards. AVEEG files were reviewed on [facility] server, Licensed Software provided by [employer] with a digital high frequency filter set at 70 Hz and a low frequency filter set at 1 Hz with a paper speed of 26mm/s resulting in 10 seconds per digital page. This entire AVEEG was reviewed by the EEG Technologist. Random time samples, random sleep samples, clips, patient initiated push button files with included patient daily diary logs, EEG Technologist pruned data was reviewed and verified for accuracy and validity by the governing reading neurologist in full  details. This AEEGV was fully compliant with all requirements for CPT 97500 for setup, patient education, take down and administered by an EEG technologist. Long-Term EEG with Video was monitored intermittently by a qualified EEG technologist for the entirety of the recording; quality check-ins were performed at a minimum of every two hours, checking and documenting real-time data and video to assure the integrity and quality of the recording (e.g., camera position, electrode integrity and impedance), and identify the need for maintenance. For intermittent monitoring, an EEG Technologist monitored no more than 12 patients concurrently. Diagnostic video was captured at least 80% of the time during the recording.  PATIENT EVENTS: There were no patient events noted or captured during this recording.  TECHNOLOGIST EVENTS: No clear epileptiform activity was detected by the reviewing neurodiagnostic technologist during the recording for further evaluation.  TIME SAMPLES: 10-minutes of every 2 hours recorded are reviewed as random time samples.  SLEEP SAMPLES: 5-minutes of every 24 hours recorded are reviewed as random sleep samples.  AWAKE: At maximal level of alertness, the posterior dominant background activity was continuous, reactive, low voltage rhythm of 9 Hz. This was symmetric, well-modulated, and attenuated with eye opening. Diffuse, symmetric, frontocentral beta range activity was present.  SLEEP: N1 Sleep (Stage 1) was observed and characterized by the disappearance of alpha rhythm and the appearance of vertex activity. N2 Sleep (Stage 2) was observed and characterized by vertex waves, K-complexes, and sleep spindles. N3 (Stage 3) sleep was observed and characterized by high amplitude Delta activity of 20%. REM sleep was observed.  EKG: There were no arrhythmias or abnormalities noted during this recording.   Impression: This is a normal 51 hours video ambulatory EEG. There were no  seizure,  events or epileptiform discharges seen during this recording. Normal EEGs, however does not rule out epilepsy.   Windell Norfolk, MD [facility]

## 2022-10-13 ENCOUNTER — Other Ambulatory Visit: Payer: Self-pay | Admitting: Neurology

## 2022-10-14 ENCOUNTER — Other Ambulatory Visit: Payer: Self-pay | Admitting: Cardiology

## 2022-10-14 NOTE — Progress Notes (Signed)
This was resulted. I sent patient a message.

## 2022-10-16 IMAGING — CR DG CHEST 2V
2 series · 2 of 2 positions shown · non-contrast
Comparison: None.

CLINICAL DATA: Chest pain and shortness of breath since last
evening.

EXAM:
CHEST - 2 VIEW

[w chest pa]
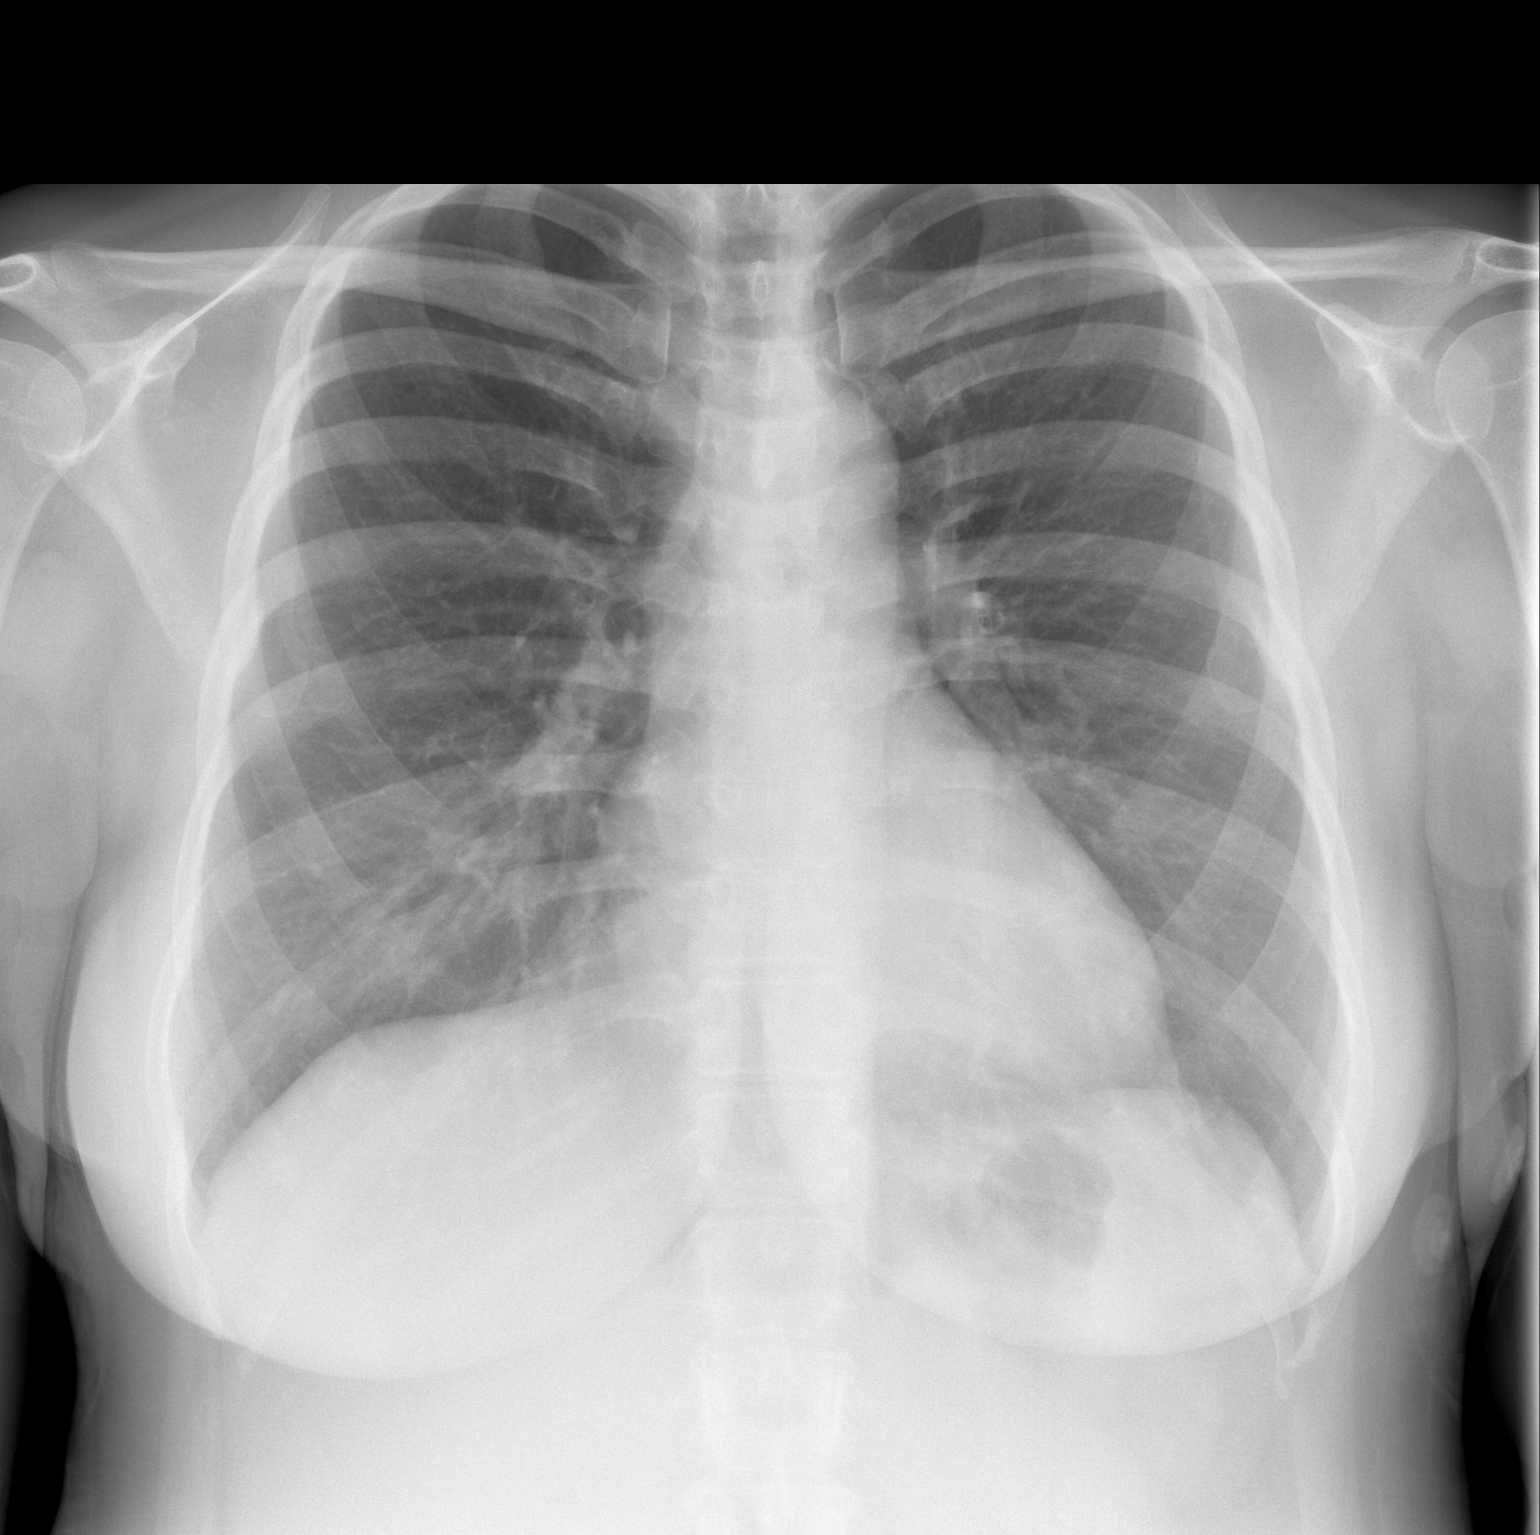

[w chest lat]
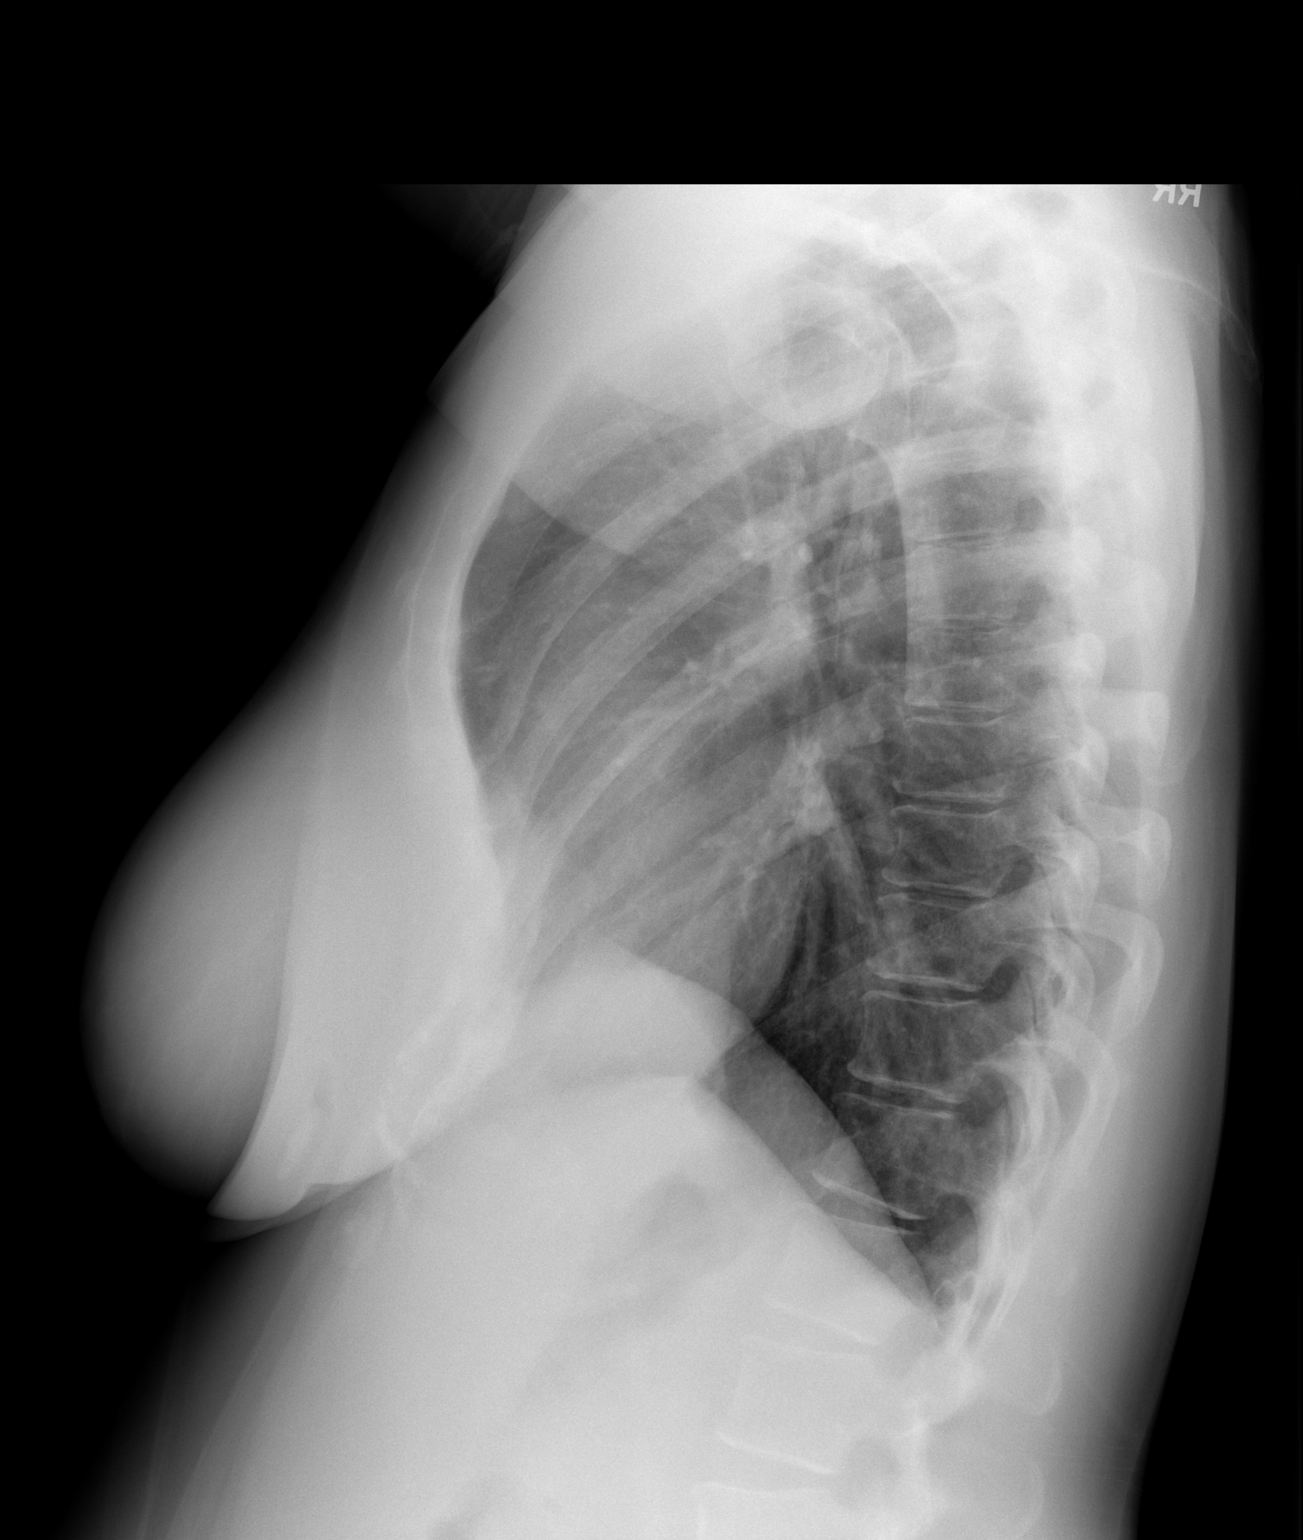

[2 of 2 positions shown; findings below may reference images not displayed]

FINDINGS: The cardiac silhouette, mediastinal and hilar contours are within
normal limits. The lungs are clear of an acute process. No pleural
effusions or pulmonary lesions.

The bony thorax is intact. There is a moderate pectus deformity
noted.
IMPRESSION: No acute cardiopulmonary findings.

## 2022-12-16 ENCOUNTER — Other Ambulatory Visit: Payer: Self-pay | Admitting: Cardiology

## 2023-01-06 ENCOUNTER — Other Ambulatory Visit: Payer: Self-pay | Admitting: Cardiology

## 2023-01-07 ENCOUNTER — Other Ambulatory Visit: Payer: Self-pay | Admitting: Cardiology

## 2023-02-03 ENCOUNTER — Telehealth: Payer: Self-pay | Admitting: Neurology

## 2023-02-03 ENCOUNTER — Encounter: Payer: Self-pay | Admitting: Neurology

## 2023-02-03 NOTE — Telephone Encounter (Signed)
She reported 2 seizure like spell in past 4 months, taking keppra 1000/1500, will increase her keppra to 1500mg  bid.  51 hour video eeg monitoring in May 2024 was normal.   Keppra level in Feb was within therapeutic limit 15.6  If she continues to have recurrent spells with max. Dose of keppra, may consider other etiology for her spells or change to different aeds,   On schedule with [facility] Oct 16th

## 2023-02-03 NOTE — Telephone Encounter (Signed)
Pt reports that since her levETIRAcetam (KEPPRA) 500 MG tablet  had a dose increase she has been sick for a couple of weeks.  Pt states it is affecting her life and job, this has her very frustrated, pt is asking to be called.  Pt declined sending a my chart message to provider.

## 2023-02-03 NOTE — Telephone Encounter (Signed)
Call to patient no answer, left message to return call

## 2023-02-03 NOTE — Telephone Encounter (Signed)
Patient returned call and states that for several years she has had diarrhea and vomiting, up to 5 times a night. She has seen PCP and  and allergist. She takes keppra 1000 mg in the morning and 1500 mg at night. She is wondering if this could be side effects of Keppra as she reports not eating much food and eating the same types of food. She reports a starring off seizure 2 months ago and another staring off seizure 2 months prior to that. She verbalized understanding of [place] driving law and seizures, and asked for a note about working from home since she has that ability. She denies SI/HI but states this vomiting and diarrhea is affecting her life. She has an appt with GI provider in October and sees Margie Ege, NP in October as well. Advise I will send to Dr. Terrace Arabia to review. Patient appreciative of call.

## 2023-02-03 NOTE — Telephone Encounter (Signed)
Call back to patient, she verbalized understanding of increased dose of keppra 1500 mg twice daily. Letter faxed to [email or web address]. Again she denies SI/HI when asked, but tearful on the phone about not driving at this time. She verbalized understanding of side effects of keppra and to call with increase irritability or mood changes.  Patient states will mychart message [place] with exact date of last seizure. She was appreciative of call

## 2023-02-09 ENCOUNTER — Other Ambulatory Visit: Payer: Self-pay | Admitting: Obstetrics and Gynecology

## 2023-02-09 DIAGNOSIS — R928 Other abnormal and inconclusive findings on diagnostic imaging of breast: Secondary | ICD-10-CM

## 2023-02-11 ENCOUNTER — Other Ambulatory Visit: Payer: Self-pay | Admitting: Gastroenterology

## 2023-02-11 DIAGNOSIS — R110 Nausea: Secondary | ICD-10-CM

## 2023-02-16 ENCOUNTER — Ambulatory Visit
Admission: RE | Admit: 2023-02-16 | Discharge: 2023-02-16 | Disposition: A | Discharge status: Home / Self Care | Payer: No Typology Code available for payment source | Source: Ambulatory Visit | Attending: Gastroenterology

## 2023-02-16 DIAGNOSIS — R110 Nausea: Secondary | ICD-10-CM

## 2023-02-17 ENCOUNTER — Other Ambulatory Visit (HOSPITAL_COMMUNITY): Payer: Self-pay | Admitting: Gastroenterology

## 2023-02-17 DIAGNOSIS — R1011 Right upper quadrant pain: Secondary | ICD-10-CM

## 2023-02-17 DIAGNOSIS — R110 Nausea: Secondary | ICD-10-CM

## 2023-02-19 ENCOUNTER — Ambulatory Visit: Payer: No Typology Code available for payment source | Admitting: Cardiology

## 2023-02-23 ENCOUNTER — Encounter (HOSPITAL_COMMUNITY): Payer: Self-pay

## 2023-02-23 ENCOUNTER — Encounter (HOSPITAL_COMMUNITY): Payer: No Typology Code available for payment source

## 2023-02-25 ENCOUNTER — Ambulatory Visit
Admission: RE | Admit: 2023-02-25 | Discharge: 2023-02-25 | Disposition: A | Discharge status: Home / Self Care | Payer: No Typology Code available for payment source | Source: Ambulatory Visit | Attending: Obstetrics and Gynecology | Admitting: Obstetrics and Gynecology

## 2023-02-25 ENCOUNTER — Other Ambulatory Visit: Payer: Self-pay | Admitting: Obstetrics and Gynecology

## 2023-02-25 ENCOUNTER — Ambulatory Visit: Admission: RE | Admit: 2023-02-25 | Payer: No Typology Code available for payment source | Source: Ambulatory Visit

## 2023-02-25 DIAGNOSIS — R928 Other abnormal and inconclusive findings on diagnostic imaging of breast: Secondary | ICD-10-CM

## 2023-03-03 ENCOUNTER — Telehealth: Payer: No Typology Code available for payment source | Admitting: Neurology

## 2023-03-11 ENCOUNTER — Ambulatory Visit (HOSPITAL_COMMUNITY)
Admission: RE | Admit: 2023-03-11 | Discharge: 2023-03-11 | Disposition: A | Discharge status: Home / Self Care | Payer: No Typology Code available for payment source | Source: Ambulatory Visit | Attending: Gastroenterology | Admitting: Gastroenterology

## 2023-03-11 DIAGNOSIS — R1011 Right upper quadrant pain: Secondary | ICD-10-CM | POA: Insufficient documentation

## 2023-03-11 DIAGNOSIS — R110 Nausea: Secondary | ICD-10-CM | POA: Insufficient documentation

## 2023-03-11 MED ORDER — TECHNETIUM TC 99M MEBROFENIN IV KIT
5.5000 | PACK | Freq: Once | INTRAVENOUS | Status: AC | PRN
  Administered 2023-03-11: 5.5 via INTRAVENOUS

## 2023-03-26 ENCOUNTER — Ambulatory Visit: Payer: Self-pay | Admitting: Surgery

## 2023-03-26 NOTE — H&P (View-Only) (Signed)
Stacey Pena   Referring Provider:  Kathi Der, MD   Subjective   Chief Complaint: New Consultation     History of Present Illness:    42 year old woman with history of seizures who presents for evaluation of biliary dyskinesia.  She was evaluated by Dr. Levora Angel at the end of September with nausea and vomiting as well as diarrhea.  Similar symptoms in 2020.  Per patient this has been going on for at least a couple of years and she actually describes nocturnal left lower quadrant abdominal pain which is constant, unrelieved by bowel movements which she does have multiple during the night, as well as nocturnal emesis.  Does not seem to be aggravated by eating any type of food.  They eat relatively healthy, typically have dinner around 6:30 PM and symptoms usually begin around 2 AM.  Has undergone a GI pathogen panel, EGD, in the past and does have eosinophilic esophagitis which is well-managed on daily PPI.  Per Dr. Rich Brave notes, she noted nausea and vomiting mostly after food intake associated with fullness and early satiety.  Nocturnal diarrhea between 2 AM and 7 AM during which she will have 5-6 bowel movements.  Also complained of left lower abdominal cramps.  She had lab work done at that visit with findings of total bilirubin 1.6, AST minimally elevated at 54, CBC unremarkable, C. difficile and repeat GI pathogen panel negative.   Ultrasound 02/16/2023: No gallstones, no sonographic evidence of cholecystitis, common bile duct 4.1 mm, likely hemangioma in the right hepatic lobe Hida at 03/11/2023: Patent biliary system, ejection fraction 94%  Review of Systems: A complete review of systems was obtained from the patient.  I have reviewed this information and discussed as appropriate with the patient.  See HPI as well for other ROS.   Medical History: Past Medical History:  Diagnosis Date   Seizures (CMS/HHS-HCC)     There is no problem list on file for  this patient.   History reviewed. No pertinent surgical history.   No Known Allergies  Current Outpatient Medications on File Prior to Visit  Medication Sig Dispense Refill   cyanocobalamin (VITAMIN B12) 1000 MCG tablet Take 1,000 mcg by mouth once daily     fenofibrate nanocrystallized (TRICOR) 48 MG tablet Take 48 mg by mouth     hyoscyamine (LEVSIN/SL) 0.125 mg SL tablet PLACE 1 TABLET UNDER THE TONGUE AND ALLOW TO DISSOVLE TWICE DAILY AS NEEDED.     Lactobac no.41/Bifidobact no.7 (PROBIOTIC-10 ORAL) Take 1 tablet by mouth once daily     levETIRAcetam (KEPPRA) 500 MG tablet 2 at morning, 3 at night     magnesium chloride (MAG DELAY) 64 mg DR tablet Take 64 mg by mouth     norethindrone (AYGESTIN) 5 mg tablet TAKE 1 TABLET EVERY DAY BY ORAL ROUTE FOR 12 DAYS.     pantoprazole (PROTONIX) 40 MG DR tablet TAKE 1 TABLET ORALLY EVERY MORNING 90 DAYS     potassium chloride (K-TAB) 20 mEq TbER ER tablet Take 20 mEq by mouth     No current facility-administered medications on file prior to visit.    Family History  Problem Relation Age of Onset   Hyperlipidemia (Elevated cholesterol) Mother    High blood pressure (Hypertension) Father    Hyperlipidemia (Elevated cholesterol) Father      Social History   Tobacco Use  Smoking Status Former   Types: Cigarettes   Start date: 2014  Smokeless Tobacco Never  Social History   Socioeconomic History   Marital status: Married  Tobacco Use   Smoking status: Former    Types: Cigarettes    Start date: 2014   Smokeless tobacco: Never  Substance and Sexual Activity   Alcohol use: Yes   Drug use: Never    Objective:    Vitals:   03/26/23 1430 03/26/23 1431  BP: 100/60   Pulse: 109   Temp: 36.6 C (97.9 F)   SpO2: 98%   Weight: 79.3 kg (174 lb 12.8 oz)   Height: 165.1 cm (5\' 5" )   PainSc:    4  PainLoc:  Abdomen    Body mass index is 29.09 kg/m.  Gen: A&Ox3, no distress  Unlabored respirations Abdomen soft,  nondistended, mildly tender in the left lower quadrant; no mass or organomegaly.   Assessment and Plan:  Diagnoses and all orders for this visit:  Biliary dyskinesia Comments: hyperkinesia with ejection fraction 94% on HIDA  While I do not think her left lower quadrant pain is related to her gallbladder, suspect the nausea, vomiting and possibly the diarrhea may improve with cholecystectomy.  We discussed laparoscopic or robotic cholecystectomy with possible cholangiogram.  Diagnostic laparoscopy to examine the left lower quadrant at time of surgery.  Discussed the relevant anatomy using a diagram to demonstrate, and went over surgical technique.  Discussed risks of surgery including bleeding, infection, pain, scarring, intraabdominal injury specifically to the common bile duct and sequelae, subtotal cholecystectomy, bile leak, conversion to open surgery, failure to resolve symptoms, blood clots/ pulmonary embolus, heart attack, pneumonia, stroke, death.  If ongoing pain and nothing grossly abnormal intraoperatively, would recommend CT scan and possibly colonoscopy as next steps.  Questions welcomed and answered to patient's satisfaction.  Patient wishes to proceed with surgery.   Dodge Ator Carlye Grippe, MD

## 2023-03-26 NOTE — H&P (Signed)
Stacey Pena [id number]   Referring Provider:  Kathi Der, MD   Subjective   Chief Complaint: New Consultation     History of Present Illness:    42 year old woman with history of seizures who presents for evaluation of biliary dyskinesia.  She was evaluated by Dr. Levora Angel at the end of September with nausea and vomiting as well as diarrhea.  Similar symptoms in 2020.  Per patient this has been going on for at least a couple of years and she actually describes nocturnal left lower quadrant abdominal pain which is constant, unrelieved by bowel movements which she does have multiple during the night, as well as nocturnal emesis.  Does not seem to be aggravated by eating any type of food.  They eat relatively healthy, typically have dinner around 6:30 PM and symptoms usually begin around 2 AM.  Has undergone a GI pathogen panel, EGD, in the past and does have eosinophilic esophagitis which is well-managed on daily PPI.  Per Dr. Rich Brave notes, she noted nausea and vomiting mostly after food intake associated with fullness and early satiety.  Nocturnal diarrhea between 2 AM and 7 AM during which she will have 5-6 bowel movements.  Also complained of left lower abdominal cramps.  She had lab work done at that visit with findings of total bilirubin 1.6, AST minimally elevated at 54, CBC unremarkable, C. difficile and repeat GI pathogen panel negative.   Ultrasound 02/16/2023: No gallstones, no sonographic evidence of cholecystitis, common bile duct 4.1 mm, likely hemangioma in the right hepatic lobe Hida at 03/11/2023: Patent biliary system, ejection fraction 94%  Review of Systems: A complete review of systems was obtained from the patient.  I have reviewed this information and discussed as appropriate with the patient.  See HPI as well for other ROS.   Medical History: Past Medical History:  Diagnosis Date   Seizures (CMS/HHS-HCC)     There is no problem list on file for  this patient.   History reviewed. No pertinent surgical history.   No Known Allergies  Current Outpatient Medications on File Prior to Visit  Medication Sig Dispense Refill   cyanocobalamin (VITAMIN B12) 1000 MCG tablet Take 1,000 mcg by mouth once daily     fenofibrate nanocrystallized (TRICOR) 48 MG tablet Take 48 mg by mouth     hyoscyamine (LEVSIN/SL) 0.125 mg SL tablet PLACE 1 TABLET UNDER THE TONGUE AND ALLOW TO DISSOVLE TWICE DAILY AS NEEDED.     Lactobac no.41/Bifidobact no.7 (PROBIOTIC-10 ORAL) Take 1 tablet by mouth once daily     levETIRAcetam (KEPPRA) 500 MG tablet 2 at morning, 3 at night     magnesium chloride (MAG DELAY) 64 mg DR tablet Take 64 mg by mouth     norethindrone (AYGESTIN) 5 mg tablet TAKE 1 TABLET EVERY DAY BY ORAL ROUTE FOR 12 DAYS.     pantoprazole (PROTONIX) 40 MG DR tablet TAKE 1 TABLET ORALLY EVERY MORNING 90 DAYS     potassium chloride (K-TAB) 20 mEq TbER ER tablet Take 20 mEq by mouth     No current facility-administered medications on file prior to visit.    Family History  Problem Relation Age of Onset   Hyperlipidemia (Elevated cholesterol) Mother    High blood pressure (Hypertension) Father    Hyperlipidemia (Elevated cholesterol) Father      Social History   Tobacco Use  Smoking Status Former   Types: Cigarettes   Start date: 2014  Smokeless Tobacco Never  Social History   Socioeconomic History   Marital status: Married  Tobacco Use   Smoking status: Former    Types: Cigarettes    Start date: 2014   Smokeless tobacco: Never  Substance and Sexual Activity   Alcohol use: Yes   Drug use: Never    Objective:    Vitals:   03/26/23 1430 03/26/23 1431  BP: 100/60   Pulse: 109   Temp: 36.6 C (97.9 F)   SpO2: 98%   Weight: 79.3 kg (174 lb 12.8 oz)   Height: 165.1 cm (5\' 5" )   PainSc:    4  PainLoc:  Abdomen    Body mass index is 29.09 kg/m.  Gen: A&Ox3, no distress  Unlabored respirations Abdomen soft,  nondistended, mildly tender in the left lower quadrant; no mass or organomegaly.   Assessment and Plan:  Diagnoses and all orders for this visit:  Biliary dyskinesia Comments: hyperkinesia with ejection fraction 94% on HIDA  While I do not think her left lower quadrant pain is related to her gallbladder, suspect the nausea, vomiting and possibly the diarrhea may improve with cholecystectomy.  We discussed laparoscopic or robotic cholecystectomy with possible cholangiogram.  Diagnostic laparoscopy to examine the left lower quadrant at time of surgery.  Discussed the relevant anatomy using a diagram to demonstrate, and went over surgical technique.  Discussed risks of surgery including bleeding, infection, pain, scarring, intraabdominal injury specifically to the common bile duct and sequelae, subtotal cholecystectomy, bile leak, conversion to open surgery, failure to resolve symptoms, blood clots/ pulmonary embolus, heart attack, pneumonia, stroke, death.  If ongoing pain and nothing grossly abnormal intraoperatively, would recommend CT scan and possibly colonoscopy as next steps.  Questions welcomed and answered to patient's satisfaction.  Patient wishes to proceed with surgery.   [name] Carlye Grippe, MD

## 2023-03-31 ENCOUNTER — Other Ambulatory Visit: Payer: Self-pay | Admitting: Gastroenterology

## 2023-03-31 DIAGNOSIS — R109 Unspecified abdominal pain: Secondary | ICD-10-CM

## 2023-03-31 DIAGNOSIS — R195 Other fecal abnormalities: Secondary | ICD-10-CM

## 2023-03-31 NOTE — Progress Notes (Signed)
COVID Vaccine received:  []  No []  Yes Date of any COVID positive Test in last 90 days:  PCP - Lavina Hamman, MD [phone] (Work)  [phone] (Fax)  Cardiologist - Epifanio Lesches, MD  Theron Arista 10-25-2020  re: palps Neurology- Levert Feinstein, MD  Rosario Jacks, MD [facility]  [phone] (Work)  [phone] (Fax)   Chest x-ray -  EKG -  08-30-2022  Epic Stress Test - 11-08-2020  Epic ECHO - 11-29-2020  Epic Cardiac Cath -  Coronary calcium score of 0  on 11-19-2020  Epic Zio Monitor- 10-16-2020  Epic  PCR screen: []  Ordered & Completed []   No Order but Needs PROFEND     [x]   N/A for this surgery  Surgery Plan:  [x]  Ambulatory   []  Outpatient in bed  []  Admit Anesthesia:    [x]  General  []  Spinal  []   Choice []   MAC  Bowel Prep - [x]  No  []   Yes ______  Pacemaker / ICD device [x]  No []  Yes   Spinal Cord Stimulator:[x]  No []  Yes       History of Sleep Apnea? [x]  No []  Yes   CPAP used?- [x]  No []  Yes    Does the patient monitor blood sugar?   [x]  N/A   []  No []  Yes  Patient has: [x]  NO Hx DM   []  Pre-DM   []  DM1  []   DM2  Blood Thinner / Instructions: none Aspirin Instructions:  none  ERAS Protocol Ordered: [x]  No  []  Yes Patient is to be NPO after: midnight prior  Dental hx: []  Dentures:  []  N/A      []  Bridge or Partial:                   []  Loose or Damaged teeth:   Comments:   Activity level: Patient is able / unable to climb a flight of stairs without difficulty; []  No CP  []  No SOB, but would have ___   Patient can / can not perform ADLs without assistance.   Anesthesia review: Seizure (Last:    ) difficulty with speech, migraines, GERD, hx Palps-  worked up 2022 by Dr. Bjorn Pippin (Brother had WPW, Father had A.fib.)   Patient denies shortness of breath, fever, cough and chest pain at PAT appointment.  Patient verbalized understanding and agreement to the Pre-Surgical Instructions that were given to them at this PAT appointment. Patient was also educated of the  need to review these PAT instructions again prior to her surgery.I reviewed the appropriate phone numbers to call if they have any and questions or concerns.

## 2023-03-31 NOTE — Patient Instructions (Signed)
SURGICAL WAITING ROOM VISITATION Patients having surgery or a procedure may have no more than 2 support people in the waiting area - these visitors may rotate in the visitor waiting room.   Due to an increase in RSV and influenza rates and associated hospitalizations, children ages 60 and under may not visit patients in [facility] hospitals. If the patient needs to stay at the hospital during part of their recovery, the visitor guidelines for inpatient rooms apply.  PRE-OP VISITATION  Pre-op nurse will coordinate an appropriate time for 1 support person to accompany the patient in pre-op.  This support person may not rotate.  This visitor will be contacted when the time is appropriate for the visitor to come back in the pre-op area.  Please refer to the [facility] website for the visitor guidelines for Inpatients (after your surgery is over and you are in a regular room).  You are not required to quarantine at this time prior to your surgery. However, you must do this: Hand Hygiene often Do NOT share personal items Notify your provider if you are in close contact with someone who has COVID or you develop fever 100.4 or greater, new onset of sneezing, cough, sore throat, shortness of breath or body aches.  If you test positive for Covid or have been in contact with anyone that has tested positive in the last 10 days please notify you surgeon.    Your procedure is scheduled on:  Friday  April 09, 2023  Report to [facility] Main Entrance: [address] entrance where the [employer] is available.   Report to admitting at: 06:45    AM  Call this number if you have any questions or problems the morning of surgery [phone]  DO NOT EAT OR DRINK ANYTHING AFTER MIDNIGHT THE NIGHT PRIOR TO YOUR SURGERY / PROCEDURE.   FOLLOW  ANY ADDITIONAL PRE OP INSTRUCTIONS YOU RECEIVED FROM YOUR SURGEON'S OFFICE!!!   Oral Hygiene is also important to reduce your risk of infection.         Remember - BRUSH YOUR TEETH THE MORNING OF SURGERY WITH YOUR REGULAR TOOTHPASTE  Do NOT smoke after Midnight the night before surgery.  STOP TAKING all Vitamins, Herbs and supplements 1 week before your surgery.   Take ONLY these medicines the morning of surgery with A SIP OF WATER: pantoprazole (Protonix), Levetiracetam (Keppra)                   You may not have any metal on your body including hair pins, jewelry, and body piercing  Do not wear make-up, lotions, powders, perfumes or deodorant  Do not wear nail polish including gel and S&S, artificial / acrylic nails, or any other type of covering on natural nails including finger and toenails. If you have artificial nails, gel coating, etc., that needs to be removed by a nail salon, Please have this removed prior to surgery. Not doing so may mean that your surgery could be cancelled or delayed if the Surgeon or anesthesia staff feels like they are unable to monitor you safely.   Do not shave 48 hours prior to surgery to avoid nicks in your skin which may contribute to postoperative infections.   Contacts, Hearing Aids, dentures or bridgework may not be worn into surgery. DENTURES WILL BE REMOVED PRIOR TO SURGERY PLEASE DO NOT APPLY "Poly grip" OR ADHESIVES!!!  Patients discharged on the day of surgery will not be allowed to drive home.  Someone NEEDS  to stay with you for the first 24 hours after anesthesia.  Do not bring your home medications to the hospital. The Pharmacy will dispense medications listed on your medication list to you during your admission in the Hospital.   Please read over the following fact sheets you were given: IF YOU HAVE QUESTIONS ABOUT YOUR PRE-OP INSTRUCTIONS, PLEASE CALL [phone].   [place] - Preparing for Surgery Before surgery, you can play an important role.  Because skin is not sterile, your skin needs to be as free of germs as possible.  You can reduce the number of germs on your skin by washing  with CHG (chlorahexidine gluconate) soap before surgery.  CHG is an antiseptic cleaner which kills germs and bonds with the skin to continue killing germs even after washing. Please DO NOT use if you have an allergy to CHG or antibacterial soaps.  If your skin becomes reddened/irritated stop using the CHG and inform your nurse when you arrive at Short Stay. Do not shave (including legs and underarms) for at least 48 hours prior to the first CHG shower.  You may shave your face/neck.  Please follow these instructions carefully:  1.  Shower with CHG Soap the night before surgery and the  morning of surgery.  2.  If you choose to wash your hair, wash your hair first as usual with your normal  shampoo.  3.  After you shampoo, rinse your hair and body thoroughly to remove the shampoo.                             4.  Use CHG as you would any other liquid soap.  You can apply chg directly to the skin and wash.  Gently with a scrungie or clean washcloth.  5.  Apply the CHG Soap to your body ONLY FROM THE NECK DOWN.   Do not use on face/ open                           Wound or open sores. Avoid contact with eyes, ears mouth and genitals (private parts).                       Wash face,  Genitals (private parts) with your normal soap.             6.  Wash thoroughly, paying special attention to the area where your  surgery  will be performed.  7.  Thoroughly rinse your body with warm water from the neck down.  8.  DO NOT shower/wash with your normal soap after using and rinsing off the CHG Soap.            9.  Pat yourself dry with a clean towel.            10.  Wear clean pajamas.            11.  Place clean sheets on your bed the night of your first shower and do not  sleep with pets.  ON THE DAY OF SURGERY : Do not apply any lotions/deodorants the morning of surgery.  Please wear clean clothes to the hospital/surgery center.    FAILURE TO FOLLOW THESE INSTRUCTIONS MAY RESULT IN THE CANCELLATION OF  YOUR SURGERY  PATIENT SIGNATURE_________________________________  NURSE SIGNATURE__________________________________  ________________________________________________________________________

## 2023-04-01 ENCOUNTER — Encounter (HOSPITAL_COMMUNITY)
Admission: RE | Admit: 2023-04-01 | Discharge: 2023-04-01 | Disposition: A | Discharge status: Home / Self Care | Payer: No Typology Code available for payment source | Source: Ambulatory Visit | Attending: Surgery | Admitting: Surgery

## 2023-04-01 ENCOUNTER — Other Ambulatory Visit: Payer: Self-pay

## 2023-04-01 ENCOUNTER — Encounter (HOSPITAL_COMMUNITY): Payer: Self-pay

## 2023-04-01 VITALS — BP 113/80 | HR 84 | Temp 98.6°F | Resp 16 | Ht 65.0 in | Wt 172.0 lb

## 2023-04-01 DIAGNOSIS — Z01818 Encounter for other preprocedural examination: Secondary | ICD-10-CM

## 2023-04-01 DIAGNOSIS — Z01812 Encounter for preprocedural laboratory examination: Secondary | ICD-10-CM | POA: Diagnosis present

## 2023-04-01 DIAGNOSIS — Z79899 Other long term (current) drug therapy: Secondary | ICD-10-CM | POA: Insufficient documentation

## 2023-04-01 HISTORY — DX: Other specified diseases of liver: K76.89

## 2023-04-01 HISTORY — DX: Other specified abnormal findings of blood chemistry: R79.89

## 2023-04-01 HISTORY — DX: Headache, unspecified: R51.9

## 2023-04-01 HISTORY — DX: Myoneural disorder, unspecified: G70.9

## 2023-04-01 HISTORY — DX: Gastro-esophageal reflux disease without esophagitis: K21.9

## 2023-04-01 HISTORY — DX: Personal history of other specified conditions: Z87.898

## 2023-04-01 LAB — COMPREHENSIVE METABOLIC PANEL
ALT: 18 U/L (ref 0–44)
AST: 31 U/L (ref 15–41)
Albumin: 4.3 g/dL (ref 3.5–5.0)
Alkaline Phosphatase: 29 U/L — ABNORMAL LOW (ref 38–126)
Anion gap: 7 (ref 5–15)
BUN: 8 mg/dL (ref 6–20)
CO2: 25 mmol/L (ref 22–32)
Calcium: 9 mg/dL (ref 8.9–10.3)
Chloride: 102 mmol/L (ref 98–111)
Creatinine, Ser: 0.54 mg/dL (ref 0.44–1.00)
GFR, Estimated: >60 mL/min (ref >60)
Glucose, Bld: 108 mg/dL — ABNORMAL HIGH (ref 70–99)
Potassium: 3.9 mmol/L (ref 3.5–5.1)
Sodium: 134 mmol/L — ABNORMAL LOW (ref 135–145)
Total Bilirubin: 1.1 mg/dL (ref <1.2)
Total Protein: 7.3 g/dL (ref 6.5–8.1)

## 2023-04-01 LAB — CBC
HCT: 38.1 % (ref 36.0–46.0)
Hemoglobin: 12.7 g/dL (ref 12.0–15.0)
MCH: 35.7 pg — ABNORMAL HIGH (ref 26.0–34.0)
MCHC: 33.3 g/dL (ref 30.0–36.0)
MCV: 107 fL — ABNORMAL HIGH (ref 80.0–100.0)
Platelets: 341 10*3/uL (ref 150–400)
RBC: 3.56 MIL/uL — ABNORMAL LOW (ref 3.87–5.11)
RDW: 12.5 % (ref 11.5–15.5)
WBC: 7.2 10*3/uL (ref 4.0–10.5)
nRBC: 0 % (ref 0.0–0.2)

## 2023-04-01 NOTE — Progress Notes (Incomplete)
COVID Vaccine received:  []  No [x]  Yes Date of any COVID positive Test in last 90 days: none  PCP -  Henrine Screws, MD at [facility] [phone] Cardiologist - Epifanio Lesches, MD  Theron Arista 10-25-2020  re: [facility]-  Rosario Jacks, MD [facility]  [phone] (Work) [phone] (Fax)   Chest x-ray -  EKG -  08-30-2022  Epic Stress Test - 11-08-2020  Epic ECHO - 11-29-2020  Epic Cardiac Cath -  Coronary calcium score of 0  on 11-19-2020  Epic Zio Monitor- 10-16-2020  Epic  PCR screen: []  Ordered & Completed []   No Order but Needs PROFEND     [x]   N/A for this surgery  Surgery Plan:  [x]  Ambulatory   []  Outpatient in bed  []  Admit Anesthesia:    [x]  General  []  Spinal  []   Choice []   MAC  Bowel Prep - [x]  No  []   Yes ______  Pacemaker / ICD device [x]  No []  Yes   Spinal Cord Stimulator:[x]  No []  Yes       History of Sleep Apnea? [x]  No []  Yes   CPAP used?- [x]  No []  Yes    Does the patient monitor blood sugar?   [x]  N/A   []  No []  Yes  Patient has: [x]  NO Hx DM   []  Pre-DM   []  DM1  []   DM2  Blood Thinner / Instructions:  none Aspirin Instructions:   none  ERAS Protocol Ordered: [x]  No  []  Yes Patient is to be NPO after: midnight prior  Dental hx: []  Dentures:  [x]  N/A      []  Bridge or Partial:                   []  Loose or Damaged teeth:   Comments:   Activity level: Patient is able to climb a flight of stairs without difficulty; [x]  No CP but would have SOB   Patient can perform ADLs without assistance.   Anesthesia review: Seizure (Last: 12-2022) difficulty with speech, migraines, GERD, hx Palps-  worked up 2022 by Dr. Bjorn Pippin (Brother had WPW, Father had A.fib.)   Patient denies shortness of breath, fever, cough and chest pain at PAT appointment.  Patient verbalized understanding and agreement to the Pre-Surgical Instructions that were given to them at this PAT appointment. Patient was also educated of the need to review these PAT instructions again prior to  her surgery.I reviewed the appropriate phone numbers to call if they have any and questions or concerns.

## 2023-04-08 ENCOUNTER — Encounter (HOSPITAL_COMMUNITY): Payer: Self-pay | Admitting: Surgery

## 2023-04-08 NOTE — Anesthesia Preprocedure Evaluation (Addendum)
Anesthesia Evaluation  Patient identified by MRN, date of birth, ID band Patient awake    Reviewed: Allergy & Precautions, NPO status , Patient's Chart, lab work & pertinent test results  History of Anesthesia Complications Negative for: history of anesthetic complications  Airway Mallampati: II  TM Distance: >3 FB Neck ROM: Full    Dental no notable dental hx.    Pulmonary former smoker   Pulmonary exam normal        Cardiovascular negative cardio ROS Normal cardiovascular exam     Neuro/Psych  Headaches, Seizures -,     GI/Hepatic ,GERD  Medicated,,BILIARY DYSKINESIA   Endo/Other  negative endocrine ROS    Renal/GU negative Renal ROS     Musculoskeletal negative musculoskeletal ROS (+)    Abdominal   Peds  Hematology negative hematology ROS (+)   Anesthesia Other Findings Day of surgery medications reviewed with patient.  Reproductive/Obstetrics                              Anesthesia Physical Anesthesia Plan  ASA: 2  Anesthesia Plan: General   Post-op Pain Management: Tylenol PO (pre-op)*   Induction: Intravenous  PONV Risk Score and Plan: 3 and Ondansetron, Dexamethasone, Treatment may vary due to age or medical condition, Midazolam and Scopolamine patch - Pre-op  Airway Management Planned: Oral ETT  Additional Equipment: None  Intra-op Plan:   Post-operative Plan: Extubation in OR  Informed Consent: I have reviewed the patients History and Physical, chart, labs and discussed the procedure including the risks, benefits and alternatives for the proposed anesthesia with the patient or authorized representative who has indicated his/her understanding and acceptance.     Dental advisory given  Plan Discussed with: CRNA  Anesthesia Plan Comments:         Anesthesia Quick Evaluation

## 2023-04-09 ENCOUNTER — Encounter (HOSPITAL_COMMUNITY): Payer: Self-pay | Admitting: Surgery

## 2023-04-09 ENCOUNTER — Ambulatory Visit (HOSPITAL_COMMUNITY): Payer: No Typology Code available for payment source | Admitting: Anesthesiology

## 2023-04-09 ENCOUNTER — Encounter (HOSPITAL_COMMUNITY)
Admission: RE | Disposition: A | Discharge status: Home / Self Care | Payer: Self-pay | Source: Home / Self Care | Attending: Surgery

## 2023-04-09 ENCOUNTER — Other Ambulatory Visit: Payer: Self-pay

## 2023-04-09 ENCOUNTER — Ambulatory Visit (HOSPITAL_COMMUNITY)
Admission: RE | Admit: 2023-04-09 | Discharge: 2023-04-09 | Disposition: A | Discharge status: Home / Self Care | Payer: No Typology Code available for payment source | Attending: Surgery | Admitting: Surgery

## 2023-04-09 DIAGNOSIS — K811 Chronic cholecystitis: Secondary | ICD-10-CM | POA: Insufficient documentation

## 2023-04-09 DIAGNOSIS — K200 Eosinophilic esophagitis: Secondary | ICD-10-CM | POA: Diagnosis not present

## 2023-04-09 DIAGNOSIS — K828 Other specified diseases of gallbladder: Secondary | ICD-10-CM

## 2023-04-09 DIAGNOSIS — Z87891 Personal history of nicotine dependence: Secondary | ICD-10-CM | POA: Diagnosis not present

## 2023-04-09 DIAGNOSIS — Z79899 Other long term (current) drug therapy: Secondary | ICD-10-CM | POA: Insufficient documentation

## 2023-04-09 DIAGNOSIS — K219 Gastro-esophageal reflux disease without esophagitis: Secondary | ICD-10-CM | POA: Insufficient documentation

## 2023-04-09 DIAGNOSIS — Z01818 Encounter for other preprocedural examination: Secondary | ICD-10-CM

## 2023-04-09 HISTORY — PX: CHOLECYSTECTOMY: SHX55

## 2023-04-09 LAB — POCT PREGNANCY, URINE: Preg Test, Ur: NEGATIVE

## 2023-04-09 SURGERY — LAPAROSCOPIC CHOLECYSTECTOMY
Anesthesia: General

## 2023-04-09 MED ORDER — OXYCODONE HCL 5 MG/5ML PO SOLN
5.0000 mg | Freq: Once | ORAL | Status: AC | PRN
Start: 2023-04-09 — End: 2023-04-09
  Administered 2023-04-09: 5 mg via ORAL

## 2023-04-09 MED ORDER — CHLORHEXIDINE GLUCONATE 4 % EX SOLN: 60.0000 mL | Freq: Once | CUTANEOUS | Status: DC

## 2023-04-09 MED ORDER — ORAL CARE MOUTH RINSE
15.0000 mL | Freq: Once | OROMUCOSAL | Status: AC
Start: 2023-04-09 — End: 2023-04-09

## 2023-04-09 MED ORDER — LIDOCAINE 2% (20 MG/ML) 5 ML SYRINGE
INTRAMUSCULAR | Status: DC | PRN
  Administered 2023-04-09: 80 mg via INTRAVENOUS

## 2023-04-09 MED ORDER — CEFAZOLIN SODIUM-DEXTROSE 2-4 GM/100ML-% IV SOLN
2.0000 g | INTRAVENOUS | Status: AC
  Administered 2023-04-09: 2 g via INTRAVENOUS
  Filled 2023-04-09: qty 100

## 2023-04-09 MED ORDER — LACTATED RINGERS IR SOLN
Status: DC | PRN
  Administered 2023-04-09: 1000 mL

## 2023-04-09 MED ORDER — PROPOFOL 10 MG/ML IV BOLUS
INTRAVENOUS | Status: DC | PRN
  Administered 2023-04-09: 190 mg via INTRAVENOUS

## 2023-04-09 MED ORDER — ROCURONIUM BROMIDE 10 MG/ML (PF) SYRINGE
PREFILLED_SYRINGE | INTRAVENOUS | Status: DC | PRN
  Administered 2023-04-09: 50 mg via INTRAVENOUS

## 2023-04-09 MED ORDER — TRAMADOL HCL 50 MG PO TABS: 50.0000 mg | ORAL_TABLET | Freq: Four times a day (QID) | ORAL | 0 refills | Status: AC | PRN

## 2023-04-09 MED ORDER — MIDAZOLAM HCL 5 MG/5ML IJ SOLN
INTRAMUSCULAR | Status: DC | PRN
  Administered 2023-04-09: 2 mg via INTRAVENOUS

## 2023-04-09 MED ORDER — 0.9 % SODIUM CHLORIDE (POUR BTL) OPTIME
TOPICAL | Status: DC | PRN
  Administered 2023-04-09: 1000 mL

## 2023-04-09 MED ORDER — BUPIVACAINE LIPOSOME 1.3 % IJ SUSP: 20.0000 mL | Freq: Once | INTRAMUSCULAR | Status: DC

## 2023-04-09 MED ORDER — CHLORHEXIDINE GLUCONATE 0.12 % MT SOLN
15.0000 mL | Freq: Once | OROMUCOSAL | Status: AC
  Administered 2023-04-09: 15 mL via OROMUCOSAL

## 2023-04-09 MED ORDER — ACETAMINOPHEN 500 MG PO TABS: 1000.0000 mg | ORAL_TABLET | ORAL | Status: DC

## 2023-04-09 MED ORDER — BUPIVACAINE HCL (PF) 0.25 % IJ SOLN
INTRAMUSCULAR | Status: AC
  Filled 2023-04-09: qty 30

## 2023-04-09 MED ORDER — DEXAMETHASONE SODIUM PHOSPHATE 10 MG/ML IJ SOLN
INTRAMUSCULAR | Status: DC | PRN
  Administered 2023-04-09: 10 mg via INTRAVENOUS

## 2023-04-09 MED ORDER — PHENYLEPHRINE 80 MCG/ML (10ML) SYRINGE FOR IV PUSH (FOR BLOOD PRESSURE SUPPORT)
PREFILLED_SYRINGE | INTRAVENOUS | Status: DC | PRN
  Administered 2023-04-09: 120 ug via INTRAVENOUS

## 2023-04-09 MED ORDER — DROPERIDOL 2.5 MG/ML IJ SOLN: 0.6250 mg | Freq: Once | INTRAMUSCULAR | Status: DC | PRN

## 2023-04-09 MED ORDER — LACTATED RINGERS IV SOLN: INTRAVENOUS | Status: DC

## 2023-04-09 MED ORDER — ACETAMINOPHEN 500 MG PO TABS
1000.0000 mg | ORAL_TABLET | Freq: Once | ORAL | Status: AC
  Administered 2023-04-09: 1000 mg via ORAL
  Filled 2023-04-09: qty 2

## 2023-04-09 MED ORDER — SCOPOLAMINE 1 MG/3DAYS TD PT72
1.0000 | MEDICATED_PATCH | Freq: Once | TRANSDERMAL | Status: DC
  Administered 2023-04-09: 1.5 mg via TRANSDERMAL
  Filled 2023-04-09: qty 1

## 2023-04-09 MED ORDER — OXYCODONE HCL 5 MG PO TABS: 5.0000 mg | ORAL_TABLET | Freq: Once | ORAL | Status: AC | PRN

## 2023-04-09 MED ORDER — GABAPENTIN 300 MG PO CAPS
300.0000 mg | ORAL_CAPSULE | ORAL | Status: AC
  Administered 2023-04-09: 300 mg via ORAL
  Filled 2023-04-09: qty 1

## 2023-04-09 MED ORDER — SUGAMMADEX SODIUM 200 MG/2ML IV SOLN
INTRAVENOUS | Status: DC | PRN
  Administered 2023-04-09: 200 mg via INTRAVENOUS

## 2023-04-09 MED ORDER — MIDAZOLAM HCL 2 MG/2ML IJ SOLN
INTRAMUSCULAR | Status: AC
  Filled 2023-04-09: qty 2

## 2023-04-09 MED ORDER — BUPIVACAINE LIPOSOME 1.3 % IJ SUSP
INTRAMUSCULAR | Status: DC | PRN
  Administered 2023-04-09: 50 mL

## 2023-04-09 MED ORDER — DOCUSATE SODIUM 100 MG PO CAPS: 100.0000 mg | ORAL_CAPSULE | Freq: Two times a day (BID) | ORAL | 0 refills | Status: AC

## 2023-04-09 MED ORDER — PROPOFOL 10 MG/ML IV BOLUS
INTRAVENOUS | Status: AC
  Filled 2023-04-09: qty 20

## 2023-04-09 MED ORDER — ONDANSETRON HCL 4 MG/2ML IJ SOLN
INTRAMUSCULAR | Status: DC | PRN
  Administered 2023-04-09: 4 mg via INTRAVENOUS

## 2023-04-09 MED ORDER — OXYCODONE HCL 5 MG/5ML PO SOLN
ORAL | Status: AC
  Filled 2023-04-09: qty 5

## 2023-04-09 MED ORDER — FENTANYL CITRATE (PF) 250 MCG/5ML IJ SOLN
INTRAMUSCULAR | Status: AC
  Filled 2023-04-09: qty 5

## 2023-04-09 MED ORDER — PHENYLEPHRINE 80 MCG/ML (10ML) SYRINGE FOR IV PUSH (FOR BLOOD PRESSURE SUPPORT)
PREFILLED_SYRINGE | INTRAVENOUS | Status: AC
Start: 2023-04-09 — End: ?
  Filled 2023-04-09: qty 10

## 2023-04-09 MED ORDER — BUPIVACAINE LIPOSOME 1.3 % IJ SUSP
INTRAMUSCULAR | Status: AC
  Filled 2023-04-09: qty 20

## 2023-04-09 MED ORDER — FENTANYL CITRATE (PF) 100 MCG/2ML IJ SOLN
INTRAMUSCULAR | Status: DC | PRN
  Administered 2023-04-09: 100 ug via INTRAVENOUS
  Administered 2023-04-09 (×2): 50 ug via INTRAVENOUS
  Administered 2023-04-09 (×2): 25 ug via INTRAVENOUS

## 2023-04-09 MED ORDER — FENTANYL CITRATE PF 50 MCG/ML IJ SOSY: 25.0000 ug | PREFILLED_SYRINGE | INTRAMUSCULAR | Status: DC | PRN

## 2023-04-09 SURGICAL SUPPLY — 40 items
APPLIER CLIP ROT 10 11.4 M/L (STAPLE) ×1
BAG COUNTER SPONGE SURGICOUNT (BAG) ×1 IMPLANT
BENZOIN TINCTURE PRP APPL 2/3 (GAUZE/BANDAGES/DRESSINGS) IMPLANT
BNDG ADH 1X3 SHEER STRL LF (GAUZE/BANDAGES/DRESSINGS) IMPLANT
CABLE HIGH FREQUENCY MONO STRZ (ELECTRODE) ×1 IMPLANT
CHLORAPREP W/TINT 26 (MISCELLANEOUS) ×1 IMPLANT
CLIP APPLIE ROT 10 11.4 M/L (STAPLE) ×1 IMPLANT
COVER MAYO STAND XLG (MISCELLANEOUS) IMPLANT
COVER SURGICAL LIGHT HANDLE (MISCELLANEOUS) ×1 IMPLANT
DERMABOND ADVANCED .7 DNX12 (GAUZE/BANDAGES/DRESSINGS) IMPLANT
DRAPE C-ARM 42X120 X-RAY (DRAPES) IMPLANT
ELECT REM PT RETURN 15FT ADLT (MISCELLANEOUS) ×1 IMPLANT
ENDOLOOP SUT PDS II 0 18 (SUTURE) IMPLANT
GLOVE BIO SURGEON STRL SZ 6 (GLOVE) ×1 IMPLANT
GLOVE INDICATOR 6.5 STRL GRN (GLOVE) ×1 IMPLANT
GOWN STRL REUS W/ TWL LRG LVL3 (GOWN DISPOSABLE) ×1 IMPLANT
GRASPER SUT TROCAR 14GX15 (MISCELLANEOUS) IMPLANT
HEMOSTAT SNOW SURGICEL 2X4 (HEMOSTASIS) IMPLANT
IRRIG SUCT STRYKERFLOW 2 WTIP (MISCELLANEOUS) ×1
IRRIGATION SUCT STRKRFLW 2 WTP (MISCELLANEOUS) ×1 IMPLANT
KIT BASIN OR (CUSTOM PROCEDURE TRAY) ×1 IMPLANT
KIT IMAGING PINPOINTPAQ (MISCELLANEOUS) IMPLANT
KIT TURNOVER KIT A (KITS) IMPLANT
NDL INSUFFLATION 14GA 120MM (NEEDLE) ×1 IMPLANT
NEEDLE INSUFFLATION 14GA 120MM (NEEDLE) ×1 IMPLANT
SCISSORS LAP 5X35 DISP (ENDOMECHANICALS) ×1 IMPLANT
SET CHOLANGIOGRAPH MIX (MISCELLANEOUS) IMPLANT
SET TUBE SMOKE EVAC HIGH FLOW (TUBING) ×1 IMPLANT
SLEEVE Z-THREAD 5X100MM (TROCAR) ×1 IMPLANT
SPIKE FLUID TRANSFER (MISCELLANEOUS) ×1 IMPLANT
STRIP CLOSURE SKIN 1/2X4 (GAUZE/BANDAGES/DRESSINGS) IMPLANT
SUT MNCRL AB 4-0 PS2 18 (SUTURE) ×1 IMPLANT
SYS BAG RETRIEVAL 10MM (BASKET) ×1
SYSTEM BAG RETRIEVAL 10MM (BASKET) ×1 IMPLANT
TOWEL OR 17X26 10 PK STRL BLUE (TOWEL DISPOSABLE) ×1 IMPLANT
TOWEL OR NON WOVEN STRL DISP B (DISPOSABLE) IMPLANT
TRAY LAPAROSCOPIC (CUSTOM PROCEDURE TRAY) ×1 IMPLANT
TROCAR ADV FIXATION 12X100MM (TROCAR) ×1 IMPLANT
TROCAR XCEL NON-BLD 5MMX100MML (ENDOMECHANICALS) IMPLANT
TROCAR Z-THREAD OPTICAL 5X100M (TROCAR) ×1 IMPLANT

## 2023-04-09 NOTE — Op Note (Addendum)
Operative Note  Stacey Pena 42 y.o. female [id number]  04/09/2023  Surgeon: Berna Bue MD FACS  Procedure performed: Laparoscopic Cholecystectomy  Preop diagnosis: biliary hyperkinesia Post-op diagnosis/intraop findings: same, enlarged/steatotic liver  Specimens: gallbladder  Retained items: none  EBL: minimal  Complications: none  Description of procedure: After confirming informed consent the patient was brought to the operating room. Antibiotics were administered. SCD's were applied. General endotracheal anesthesia was initiated and a formal time-out was performed. The abdomen was prepped and draped in the usual sterile fashion and the abdomen was entered using an infraumbilical veress needle after instilling the site with local. Insufflation to was obtained, 5mm trocar and camera inserted, and gross inspection revealed no evidence of injury from our entry or other intraabdominal abnormalities. Two 5mm trocars were introduced in the right midclavicular and right anterior axillary lines under direct visualization and following infiltration with local. An 11mm trocar was placed in the epigastrium. The gallbladder fundus was retracted cephalad and the infundibulum was retracted laterally. A combination of hook electrocautery and blunt dissection was utilized to clear the peritoneum from the neck and cystic duct, circumferentially isolating the cystic artery and cystic duct and lifting the gallbladder from the cystic plate. The critical view of safety was achieved with the cystic artery, cystic duct, and liver bed visualized between them with no other structures. The artery was clipped with a single clip proximally and distally and divided as was the cystic duct with three clips on the proximal end. The gallbladder was dissected from the liver plate using electrocautery. Once freed the gallbladder was placed in an endocatch bag and removed through the epigastric trocar site. Some  bile had been spilled from the gallbladder during its dissection from the liver bed. This was evacuated and the right upper quadrant was irrigated with warm sterile saline; the effluent was clear. Hemostasis was once again confirmed, and reinspection of the abdomen revealed no injuries. The clips were well apposed without any bile leak from the ligated cystic duct or the liver bed. The 11mm trocar site in the epigastrium was closed with a 0 vicryl in the fascia under direct visualization using a PMI device. The abdomen was desufflated and all trocars removed. The skin incisions were closed with subcuticular 4-0 monocryl and Dermabond. The patient was awakened, extubated and transported to the recovery room in stable condition.    All counts were correct at the completion of the case.

## 2023-04-09 NOTE — Anesthesia Postprocedure Evaluation (Signed)
Anesthesia Post Note  Patient: Stacey Pena  Procedure(s) Performed: LAPAROSCOPIC CHOLECYSTECTOMY     Patient location during evaluation: PACU Anesthesia Type: General Level of consciousness: awake and alert Pain management: pain level controlled Vital Signs Assessment: post-procedure vital signs reviewed and stable Respiratory status: spontaneous breathing, nonlabored ventilation and respiratory function stable Cardiovascular status: blood pressure returned to baseline Postop Assessment: no apparent nausea or vomiting Anesthetic complications: no   No notable events documented.  Last Vitals:  Vitals:   04/09/23 1130 04/09/23 1133  BP:    Pulse: 84 78  Resp:    Temp:    SpO2: 100% 99%    Last Pain:  Vitals:   04/09/23 1115  TempSrc:   PainSc: 4                  Shanda Howells

## 2023-04-09 NOTE — Discharge Instructions (Signed)
LAPAROSCOPIC SURGERY: POST OP INSTRUCTIONS   EAT Gradually transition to a high fiber diet with a fiber supplement over the next few weeks after discharge.  Start with a pureed / full liquid diet (see below)  WALK Walk an hour a day (cumulative- not all at once).  Control your pain to do that.    CONTROL PAIN Control pain so that you can walk, sleep, tolerate sneezing/coughing, go up/down stairs.  HAVE A BOWEL MOVEMENT DAILY Keep your bowels regular to avoid problems.  OK to try a laxative to override constipation.  OK to use an antidiarrheal to slow down diarrhea.  Call if not better after 2 tries  CALL IF YOU HAVE PROBLEMS/CONCERNS Call if you are still struggling despite following these instructions. Call if you have concerns not answered by these instructions    DIET: Follow a light bland diet & liquids the first 24 hours after arrival home, such as soup, liquids, starches, etc.  Be sure to drink plenty of fluids.  Quickly advance to a usual solid diet within a few days.  Avoid fast food or heavy meals initially as you are more likely to get nauseated or have irregular bowels.  A low-sugar, high-fiber diet for the rest of your life is ideal.  Take your usually prescribed home medications unless otherwise directed.  PAIN CONTROL: Pain is best controlled by a usual combination of three different methods TOGETHER: Ice/Heat Over the counter pain medication Prescription pain medication Most patients will experience some swelling and bruising around the incisions.  Ice packs or heating pads (30-60 minutes up to 6 times a day) will help. Use ice for the first few days to help decrease swelling and bruising, then switch to heat to help relax tight/sore spots and speed recovery.  Some people prefer to use ice alone, heat alone, alternating between ice & heat.  Experiment to what works for you.  Swelling and bruising can take several weeks to resolve.   It is helpful to take an  over-the-counter pain medication regularly for the first few days: Naproxen (Aleve, etc)  Two 220mg  tabs twice a day OR Ibuprofen (Advil, etc) Three 200mg  tabs four times a day (every meal & bedtime) AND Acetaminophen (Tylenol, etc) 500-1000mg  four times a day (every meal & bedtime) A  prescription for pain medication (such as oxycodone, hydrocodone, tramadol, gabapentin, methocarbamol, etc) should be given to you upon discharge.  Take your pain medication as prescribed, IF NEEDED.  If you are having problems/concerns with the prescription medicine (does not control pain, nausea, vomiting, rash, itching, etc), please call us [phone] to see if we need to switch you to a different pain medicine that will work better for you and/or control your side effect better. If you need a refill on your pain medication, please give [place] 48 hour notice.  contact your pharmacy.  They will contact our office to request authorization. Prescriptions will not be filled after 5 pm or on week-ends  Avoid getting constipated.   Between the surgery and the pain medications, it is common to experience some constipation.   Increasing fluid intake and taking a fiber supplement (such as Metamucil, Citrucel, FiberCon, MiraLax, etc) 1-2 times a day regularly will usually help prevent this problem from occurring.   A mild laxative (prune juice, Milk of Magnesia, MiraLax, etc) should be taken according to package directions if there are no bowel movements after 48 hours.   Watch out for diarrhea.   If you have many  loose bowel movements, simplify your diet to bland foods & liquids for a few days.   Stop any stool softeners and decrease your fiber supplement.   Switching to mild anti-diarrheal medications (Kayopectate, Pepto Bismol) can help.   If this worsens or does not improve, please call us.  Wash / shower every day.  You may shower over the skin glue which is waterproof.  Do not soak or submerge incisions.  No rubbing,  scrubbing, lotions or ointments to incisions.  Glue will flake off after about 2 weeks.  You may leave the incisions open to air.  You may place a dressing/Band-Aid to cover the incision for comfort if you wish.   ACTIVITIES as tolerated:   You may resume regular (light) daily activities beginning the next day--such as daily self-care, walking, climbing stairs--gradually increasing activities as tolerated.  If you can walk 30 minutes without difficulty, it is safe to try more intense activity such as jogging, treadmill, bicycling, low-impact aerobics, swimming, etc. Save the most intensive and strenuous activity for last such as sit-ups, heavy lifting, contact sports, etc  Refrain from any heavy lifting or straining until you are off narcotics for pain control.   DO NOT PUSH THROUGH PAIN.  Let pain be your guide: If it hurts to do something, don't do it.  Pain is your body warning you to avoid that activity for another week until the pain goes down. You may drive when you are no longer taking prescription pain medication, you can comfortably wear a seatbelt, and you can safely maneuver your car and apply brakes. You may have sexual intercourse when it is comfortable.  FOLLOW UP in our office Please call [facility] at [phone] to set up an appointment to see your surgeon in the office for a follow-up appointment approximately 2-3 weeks after your surgery. Make sure that you call for this appointment the day you arrive home to insure a convenient appointment time.  10. IF YOU HAVE DISABILITY OR FAMILY LEAVE FORMS, BRING THEM TO THE OFFICE FOR PROCESSING.  DO NOT GIVE THEM TO YOUR DOCTOR.   WHEN TO CALL us [phone]: Poor pain control Reactions / problems with new medications (rash/itching, nausea, etc)  Fever over 101.5 F (38.5 C) Inability to urinate Nausea and/or vomiting Worsening swelling or bruising Continued bleeding from incision. Increased pain, redness, or drainage from the  incision   The clinic staff is available to answer your questions during regular business hours (8:30am-5pm).  Please don't hesitate to call and ask to speak to one of our nurses for clinical concerns.   If you have a medical emergency, go to the nearest emergency room or call 911.  A surgeon from [facility] is always on call at the [facility], [place] [address], [place], [place]  [address] ? MAIN: [phone] ? TOLL FREE: [phone] ?  FAX [phone] [email or web address]

## 2023-04-09 NOTE — Anesthesia Procedure Notes (Signed)
Procedure Name: Intubation Date/Time: 04/09/2023 9:55 AM  Performed by: Theodosia Quay, CRNAPre-anesthesia Checklist: Patient identified, Emergency Drugs available, Suction available, Patient being monitored and Timeout performed Patient Re-evaluated:Patient Re-evaluated prior to induction Oxygen Delivery Method: Circle system utilized Preoxygenation: Pre-oxygenation with 100% oxygen Induction Type: IV induction Ventilation: Mask ventilation without difficulty Laryngoscope Size: Mac and 3 Grade View: Grade I Tube type: Oral Tube size: 7.0 mm Number of attempts: 1 Airway Equipment and Method: Stylet Placement Confirmation: ETT inserted through vocal cords under direct vision, positive ETCO2, CO2 detector and breath sounds checked- equal and bilateral Secured at: 22 cm Tube secured with: Tape Dental Injury: Teeth and Oropharynx as per pre-operative assessment  Comments: ATOI

## 2023-04-09 NOTE — Interval H&P Note (Signed)
History and Physical Interval Note:  04/09/2023 9:09 AM  Stacey Pena  has presented today for surgery, with the diagnosis of BILIARY DYSKINESIA.  The various methods of treatment have been discussed with the patient and family. After consideration of risks, benefits and other options for treatment, the patient has consented to  Procedure(s): LAPAROSCOPIC CHOLECYSTECTOMY (N/A) as a surgical intervention.  The patient's history has been reviewed, patient examined, no change in status, stable for surgery.  I have reviewed the patient's chart and labs.  Questions were answered to the patient's satisfaction.     Daliya Parchment Lollie Sails

## 2023-04-09 NOTE — Transfer of Care (Signed)
Immediate Anesthesia Transfer of Care Note  Patient: Stacey Pena  Procedure(s) Performed: Procedure(s): LAPAROSCOPIC CHOLECYSTECTOMY (N/A)  Patient Location: PACU  Anesthesia Type:General  Level of Consciousness:  sedated, patient cooperative and responds to stimulation  Airway & Oxygen Therapy:Patient Spontanous Breathing and Patient connected to face mask oxgen  Post-op Assessment:  Report given to PACU RN and Post -op Vital signs reviewed and stable  Post vital signs:  Reviewed and stable  Last Vitals:  Vitals:   04/09/23 0722 04/09/23 1045  BP: 129/88 134/82  Pulse: 87 83  Resp: 16 17  Temp: 36.5 C 37.1 C  SpO2: 97% 100%    Complications: No apparent anesthesia complications

## 2023-04-10 ENCOUNTER — Encounter (HOSPITAL_COMMUNITY): Payer: Self-pay | Admitting: Surgery

## 2023-04-12 LAB — SURGICAL PATHOLOGY

## 2023-04-23 ENCOUNTER — Ambulatory Visit
Admission: RE | Admit: 2023-04-23 | Discharge: 2023-04-23 | Disposition: A | Discharge status: Home / Self Care | Payer: No Typology Code available for payment source | Source: Ambulatory Visit | Attending: Gastroenterology

## 2023-04-23 DIAGNOSIS — R195 Other fecal abnormalities: Secondary | ICD-10-CM

## 2023-04-23 DIAGNOSIS — R109 Unspecified abdominal pain: Secondary | ICD-10-CM

## 2023-04-23 MED ORDER — IOPAMIDOL (ISOVUE-300) INJECTION 61%
500.0000 mL | Freq: Once | INTRAVENOUS | Status: AC | PRN
Start: 2023-04-23 — End: 2023-04-23
  Administered 2023-04-23: 100 mL via INTRAVENOUS

## 2023-07-06 ENCOUNTER — Other Ambulatory Visit: Payer: Self-pay | Admitting: Neurology

## 2023-07-07 ENCOUNTER — Telehealth: Payer: Self-pay

## 2023-07-07 NOTE — Telephone Encounter (Signed)
 Mychart message sent.

## 2023-07-29 ENCOUNTER — Other Ambulatory Visit (HOSPITAL_COMMUNITY): Payer: Self-pay | Admitting: Gastroenterology

## 2023-07-29 DIAGNOSIS — R112 Nausea with vomiting, unspecified: Secondary | ICD-10-CM

## 2023-08-13 ENCOUNTER — Encounter (HOSPITAL_COMMUNITY)
Admission: RE | Admit: 2023-08-13 | Discharge: 2023-08-13 | Disposition: A | Discharge status: Home / Self Care | Source: Ambulatory Visit | Attending: Gastroenterology | Admitting: Gastroenterology

## 2023-08-13 ENCOUNTER — Encounter (HOSPITAL_COMMUNITY): Payer: Self-pay

## 2023-08-13 DIAGNOSIS — R112 Nausea with vomiting, unspecified: Secondary | ICD-10-CM | POA: Insufficient documentation

## 2023-08-16 ENCOUNTER — Ambulatory Visit (HOSPITAL_COMMUNITY)

## 2023-09-09 ENCOUNTER — Encounter (HOSPITAL_COMMUNITY)
Admission: RE | Admit: 2023-09-09 | Discharge: 2023-09-09 | Disposition: A | Discharge status: Home / Self Care | Source: Ambulatory Visit | Attending: Gastroenterology | Admitting: Gastroenterology

## 2023-09-09 DIAGNOSIS — R112 Nausea with vomiting, unspecified: Secondary | ICD-10-CM | POA: Insufficient documentation

## 2023-09-09 MED ORDER — TECHNETIUM TC 99M SULFUR COLLOID FILTERED
2.1100 | Freq: Once | INTRAVENOUS | Status: AC | PRN
  Administered 2023-09-09: 2.11 via INTRADERMAL

## 2023-09-16 DIAGNOSIS — R1084 Generalized abdominal pain: Secondary | ICD-10-CM | POA: Diagnosis not present

## 2023-09-16 DIAGNOSIS — Z9049 Acquired absence of other specified parts of digestive tract: Secondary | ICD-10-CM | POA: Diagnosis not present

## 2023-09-16 DIAGNOSIS — R112 Nausea with vomiting, unspecified: Secondary | ICD-10-CM | POA: Diagnosis not present

## 2023-09-16 DIAGNOSIS — K219 Gastro-esophageal reflux disease without esophagitis: Secondary | ICD-10-CM | POA: Diagnosis not present

## 2023-09-16 DIAGNOSIS — K529 Noninfective gastroenteritis and colitis, unspecified: Secondary | ICD-10-CM | POA: Diagnosis not present

## 2023-09-22 DIAGNOSIS — B3781 Candidal esophagitis: Secondary | ICD-10-CM | POA: Diagnosis not present

## 2023-09-22 DIAGNOSIS — K2289 Other specified disease of esophagus: Secondary | ICD-10-CM | POA: Diagnosis not present

## 2023-09-22 DIAGNOSIS — K319 Disease of stomach and duodenum, unspecified: Secondary | ICD-10-CM | POA: Diagnosis not present

## 2023-09-22 DIAGNOSIS — K2950 Unspecified chronic gastritis without bleeding: Secondary | ICD-10-CM | POA: Diagnosis not present

## 2023-09-22 DIAGNOSIS — K209 Esophagitis, unspecified without bleeding: Secondary | ICD-10-CM | POA: Diagnosis not present

## 2023-09-22 DIAGNOSIS — R112 Nausea with vomiting, unspecified: Secondary | ICD-10-CM | POA: Diagnosis not present

## 2023-10-06 DIAGNOSIS — M9902 Segmental and somatic dysfunction of thoracic region: Secondary | ICD-10-CM | POA: Diagnosis not present

## 2023-10-06 DIAGNOSIS — M9903 Segmental and somatic dysfunction of lumbar region: Secondary | ICD-10-CM | POA: Diagnosis not present

## 2023-10-06 DIAGNOSIS — M9904 Segmental and somatic dysfunction of sacral region: Secondary | ICD-10-CM | POA: Diagnosis not present

## 2023-10-06 DIAGNOSIS — M9901 Segmental and somatic dysfunction of cervical region: Secondary | ICD-10-CM | POA: Diagnosis not present

## 2023-10-06 DIAGNOSIS — M530 Cervicocranial syndrome: Secondary | ICD-10-CM | POA: Diagnosis not present

## 2023-10-13 DIAGNOSIS — M9904 Segmental and somatic dysfunction of sacral region: Secondary | ICD-10-CM | POA: Diagnosis not present

## 2023-10-13 DIAGNOSIS — M9903 Segmental and somatic dysfunction of lumbar region: Secondary | ICD-10-CM | POA: Diagnosis not present

## 2023-10-13 DIAGNOSIS — M530 Cervicocranial syndrome: Secondary | ICD-10-CM | POA: Diagnosis not present

## 2023-10-13 DIAGNOSIS — M9902 Segmental and somatic dysfunction of thoracic region: Secondary | ICD-10-CM | POA: Diagnosis not present

## 2023-10-13 DIAGNOSIS — M9901 Segmental and somatic dysfunction of cervical region: Secondary | ICD-10-CM | POA: Diagnosis not present

## 2023-10-20 DIAGNOSIS — N912 Amenorrhea, unspecified: Secondary | ICD-10-CM | POA: Diagnosis not present

## 2023-11-22 DIAGNOSIS — K638219 Small intestinal bacterial overgrowth, unspecified: Secondary | ICD-10-CM | POA: Diagnosis not present

## 2023-11-26 DIAGNOSIS — R197 Diarrhea, unspecified: Secondary | ICD-10-CM | POA: Diagnosis not present

## 2023-11-26 DIAGNOSIS — R112 Nausea with vomiting, unspecified: Secondary | ICD-10-CM | POA: Diagnosis not present

## 2023-11-26 DIAGNOSIS — R1084 Generalized abdominal pain: Secondary | ICD-10-CM | POA: Diagnosis not present

## 2023-11-26 DIAGNOSIS — R140 Abdominal distension (gaseous): Secondary | ICD-10-CM | POA: Diagnosis not present

## 2024-01-03 DIAGNOSIS — K3189 Other diseases of stomach and duodenum: Secondary | ICD-10-CM | POA: Diagnosis not present

## 2024-01-03 DIAGNOSIS — K219 Gastro-esophageal reflux disease without esophagitis: Secondary | ICD-10-CM | POA: Diagnosis not present

## 2024-01-03 DIAGNOSIS — K209 Esophagitis, unspecified without bleeding: Secondary | ICD-10-CM | POA: Diagnosis not present

## 2024-02-03 DIAGNOSIS — Z1231 Encounter for screening mammogram for malignant neoplasm of breast: Secondary | ICD-10-CM | POA: Diagnosis not present

## 2024-02-03 DIAGNOSIS — Z01419 Encounter for gynecological examination (general) (routine) without abnormal findings: Secondary | ICD-10-CM | POA: Diagnosis not present

## 2024-02-03 DIAGNOSIS — Z130 Encounter for screening for diseases of the blood and blood-forming organs and certain disorders involving the immune mechanism: Secondary | ICD-10-CM | POA: Diagnosis not present

## 2024-02-03 DIAGNOSIS — Z1389 Encounter for screening for other disorder: Secondary | ICD-10-CM | POA: Diagnosis not present

## 2024-02-03 DIAGNOSIS — N951 Menopausal and female climacteric states: Secondary | ICD-10-CM | POA: Diagnosis not present
